# Patient Record
Sex: Male | Born: 1945 | Hispanic: No | Marital: Married | Smoking: Never smoker
Health system: Southern US, Community
[De-identification: ages and names within clinical notes are randomized; demographics above are authoritative.]

## PROBLEM LIST (undated history)

## (undated) DIAGNOSIS — K5792 Diverticulitis of intestine, part unspecified, without perforation or abscess without bleeding: Secondary | ICD-10-CM

## (undated) DIAGNOSIS — C439 Malignant melanoma of skin, unspecified: Secondary | ICD-10-CM

## (undated) DIAGNOSIS — I10 Essential (primary) hypertension: Secondary | ICD-10-CM

## (undated) DIAGNOSIS — E785 Hyperlipidemia, unspecified: Secondary | ICD-10-CM

## (undated) DIAGNOSIS — I4891 Unspecified atrial fibrillation: Secondary | ICD-10-CM

## (undated) HISTORY — DX: Unspecified atrial fibrillation: I48.91

## (undated) HISTORY — DX: Malignant melanoma of skin, unspecified: C43.9

## (undated) HISTORY — PX: REDUCTION OF TORSION OF TESTIS: SUR1096

## (undated) HISTORY — DX: Hyperlipidemia, unspecified: E78.5

---

## 1998-03-27 ENCOUNTER — Other Ambulatory Visit: Admission: RE | Admit: 1998-03-27 | Discharge: 1998-03-27 | Payer: Self-pay | Admitting: Dermatology

## 1998-04-03 ENCOUNTER — Other Ambulatory Visit: Admission: RE | Admit: 1998-04-03 | Discharge: 1998-04-03 | Payer: Self-pay | Admitting: Dermatology

## 2013-07-04 DIAGNOSIS — I10 Essential (primary) hypertension: Secondary | ICD-10-CM | POA: Insufficient documentation

## 2014-03-29 DIAGNOSIS — Z0000 Encounter for general adult medical examination without abnormal findings: Secondary | ICD-10-CM | POA: Insufficient documentation

## 2016-02-13 DIAGNOSIS — Z1159 Encounter for screening for other viral diseases: Secondary | ICD-10-CM | POA: Insufficient documentation

## 2016-02-13 DIAGNOSIS — K4090 Unilateral inguinal hernia, without obstruction or gangrene, not specified as recurrent: Secondary | ICD-10-CM | POA: Insufficient documentation

## 2016-04-13 ENCOUNTER — Emergency Department (HOSPITAL_COMMUNITY)
Admission: EM | Admit: 2016-04-13 | Discharge: 2016-04-13 | Disposition: A | Discharge status: Home / Self Care | Payer: Medicare Other | Attending: Emergency Medicine | Admitting: Emergency Medicine

## 2016-04-13 ENCOUNTER — Emergency Department (HOSPITAL_COMMUNITY): Payer: Medicare Other

## 2016-04-13 ENCOUNTER — Encounter (HOSPITAL_COMMUNITY)
Admission: EM | Disposition: A | Discharge status: Home / Self Care | Payer: Self-pay | Source: Home / Self Care | Attending: Emergency Medicine

## 2016-04-13 ENCOUNTER — Emergency Department (HOSPITAL_COMMUNITY): Payer: Medicare Other | Admitting: Anesthesiology

## 2016-04-13 ENCOUNTER — Encounter (HOSPITAL_COMMUNITY): Payer: Self-pay

## 2016-04-13 DIAGNOSIS — W270XXA Contact with workbench tool, initial encounter: Secondary | ICD-10-CM | POA: Insufficient documentation

## 2016-04-13 DIAGNOSIS — S61211A Laceration without foreign body of left index finger without damage to nail, initial encounter: Secondary | ICD-10-CM | POA: Insufficient documentation

## 2016-04-13 DIAGNOSIS — S61412A Laceration without foreign body of left hand, initial encounter: Secondary | ICD-10-CM

## 2016-04-13 DIAGNOSIS — S61219A Laceration without foreign body of unspecified finger without damage to nail, initial encounter: Secondary | ICD-10-CM

## 2016-04-13 DIAGNOSIS — I10 Essential (primary) hypertension: Secondary | ICD-10-CM | POA: Diagnosis not present

## 2016-04-13 DIAGNOSIS — S61213A Laceration without foreign body of left middle finger without damage to nail, initial encounter: Secondary | ICD-10-CM | POA: Diagnosis not present

## 2016-04-13 DIAGNOSIS — Z880 Allergy status to penicillin: Secondary | ICD-10-CM | POA: Insufficient documentation

## 2016-04-13 HISTORY — DX: Essential (primary) hypertension: I10

## 2016-04-13 HISTORY — PX: TENDON REPAIR: SHX5111

## 2016-04-13 SURGERY — TENDON REPAIR
Anesthesia: General | Site: Hand | Laterality: Left

## 2016-04-13 MED ORDER — TETANUS-DIPHTH-ACELL PERTUSSIS 5-2.5-18.5 LF-MCG/0.5 IM SUSP
0.5000 mL | Freq: Once | INTRAMUSCULAR | Status: AC
  Administered 2016-04-13: 0.5 mL via INTRAMUSCULAR
  Filled 2016-04-13: qty 0.5

## 2016-04-13 MED ORDER — VANCOMYCIN HCL IN DEXTROSE 1-5 GM/200ML-% IV SOLN
1000.0000 mg | Freq: Once | INTRAVENOUS | Status: AC
  Administered 2016-04-13: 1000 mg via INTRAVENOUS
  Filled 2016-04-13: qty 200

## 2016-04-13 MED ORDER — BUPIVACAINE HCL (PF) 0.25 % IJ SOLN
INTRAMUSCULAR | Status: AC
  Filled 2016-04-13: qty 30

## 2016-04-13 MED ORDER — SUFENTANIL CITRATE 50 MCG/ML IV SOLN
INTRAVENOUS | Status: DC | PRN
  Administered 2016-04-13 (×2): 5 ug via INTRAVENOUS

## 2016-04-13 MED ORDER — BUPIVACAINE HCL (PF) 0.25 % IJ SOLN
INTRAMUSCULAR | Status: DC | PRN
  Administered 2016-04-13: 10 mL

## 2016-04-13 MED ORDER — SODIUM CHLORIDE 0.9 % IJ SOLN
INTRAMUSCULAR | Status: AC
  Filled 2016-04-13: qty 10

## 2016-04-13 MED ORDER — ONDANSETRON HCL 4 MG/2ML IJ SOLN
INTRAMUSCULAR | Status: AC
  Filled 2016-04-13: qty 2

## 2016-04-13 MED ORDER — EPHEDRINE SULFATE 50 MG/ML IJ SOLN
INTRAMUSCULAR | Status: DC | PRN
  Administered 2016-04-13 (×2): 10 mg via INTRAVENOUS
  Administered 2016-04-13: 5 mg via INTRAVENOUS
  Administered 2016-04-13: 10 mg via INTRAVENOUS

## 2016-04-13 MED ORDER — PROMETHAZINE HCL 25 MG/ML IJ SOLN: 6.2500 mg | INTRAMUSCULAR | Status: DC | PRN

## 2016-04-13 MED ORDER — LACTATED RINGERS IV SOLN
INTRAVENOUS | Status: DC | PRN
  Administered 2016-04-13 (×2): via INTRAVENOUS

## 2016-04-13 MED ORDER — LIDOCAINE HCL (CARDIAC) 20 MG/ML IV SOLN
INTRAVENOUS | Status: DC | PRN
  Administered 2016-04-13: 100 mg via INTRAVENOUS

## 2016-04-13 MED ORDER — MIDAZOLAM HCL 5 MG/5ML IJ SOLN
INTRAMUSCULAR | Status: DC | PRN
Start: 2016-04-13 — End: 2016-04-13
  Administered 2016-04-13: 2 mg via INTRAVENOUS

## 2016-04-13 MED ORDER — SUFENTANIL CITRATE 50 MCG/ML IV SOLN
INTRAVENOUS | Status: AC
  Filled 2016-04-13: qty 1

## 2016-04-13 MED ORDER — 0.9 % SODIUM CHLORIDE (POUR BTL) OPTIME
TOPICAL | Status: DC | PRN
  Administered 2016-04-13: 4000 mL

## 2016-04-13 MED ORDER — MIDAZOLAM HCL 2 MG/2ML IJ SOLN
INTRAMUSCULAR | Status: AC
  Filled 2016-04-13: qty 2

## 2016-04-13 MED ORDER — DEXAMETHASONE SODIUM PHOSPHATE 10 MG/ML IJ SOLN
INTRAMUSCULAR | Status: AC
  Filled 2016-04-13: qty 1

## 2016-04-13 MED ORDER — LIDOCAINE 2% (20 MG/ML) 5 ML SYRINGE
INTRAMUSCULAR | Status: AC
  Filled 2016-04-13: qty 5

## 2016-04-13 MED ORDER — FENTANYL CITRATE (PF) 100 MCG/2ML IJ SOLN: 25.0000 ug | INTRAMUSCULAR | Status: DC | PRN

## 2016-04-13 MED ORDER — PROPOFOL 10 MG/ML IV BOLUS
INTRAVENOUS | Status: DC | PRN
Start: 2016-04-13 — End: 2016-04-13
  Administered 2016-04-13: 100 mg via INTRAVENOUS

## 2016-04-13 MED ORDER — METOCLOPRAMIDE HCL 5 MG/ML IJ SOLN
INTRAMUSCULAR | Status: DC | PRN
  Administered 2016-04-13: 10 mg via INTRAVENOUS

## 2016-04-13 MED ORDER — MORPHINE SULFATE (PF) 4 MG/ML IV SOLN
4.0000 mg | Freq: Once | INTRAVENOUS | Status: AC
  Administered 2016-04-13: 4 mg via INTRAVENOUS
  Filled 2016-04-13: qty 1

## 2016-04-13 MED ORDER — PROPOFOL 10 MG/ML IV BOLUS
INTRAVENOUS | Status: AC
  Filled 2016-04-13: qty 20

## 2016-04-13 MED ORDER — LIDOCAINE HCL 2 % IJ SOLN
20.0000 mL | Freq: Once | INTRAMUSCULAR | Status: AC
  Administered 2016-04-13: 400 mg via INTRADERMAL
  Filled 2016-04-13: qty 20

## 2016-04-13 MED ORDER — ONDANSETRON HCL 4 MG/2ML IJ SOLN
INTRAMUSCULAR | Status: DC | PRN
  Administered 2016-04-13: 4 mg via INTRAVENOUS

## 2016-04-13 MED ORDER — DEXAMETHASONE SODIUM PHOSPHATE 4 MG/ML IJ SOLN
INTRAMUSCULAR | Status: DC | PRN
  Administered 2016-04-13: 10 mg via INTRAVENOUS

## 2016-04-13 MED ORDER — METOCLOPRAMIDE HCL 5 MG/ML IJ SOLN
INTRAMUSCULAR | Status: AC
  Filled 2016-04-13: qty 2

## 2016-04-13 SURGICAL SUPPLY — 51 items
BANDAGE ACE 3X5.8 VEL STRL LF (GAUZE/BANDAGES/DRESSINGS) ×3 IMPLANT
BANDAGE ACE 4X5 VEL STRL LF (GAUZE/BANDAGES/DRESSINGS) ×3 IMPLANT
BANDAGE ELASTIC 3 VELCRO ST LF (GAUZE/BANDAGES/DRESSINGS) IMPLANT
BANDAGE ELASTIC 4 VELCRO ST LF (GAUZE/BANDAGES/DRESSINGS) IMPLANT
BANDAGE GAUZE 4  KLING STR (GAUZE/BANDAGES/DRESSINGS) ×3 IMPLANT
BNDG COHESIVE 1X5 TAN STRL LF (GAUZE/BANDAGES/DRESSINGS) IMPLANT
BNDG GAUZE ELAST 4 BULKY (GAUZE/BANDAGES/DRESSINGS) IMPLANT
CORDS BIPOLAR (ELECTRODE) ×3 IMPLANT
COVER SURGICAL LIGHT HANDLE (MISCELLANEOUS) ×3 IMPLANT
CUFF TOURNIQUET SINGLE 18IN (TOURNIQUET CUFF) IMPLANT
CUFF TOURNIQUET SINGLE 24IN (TOURNIQUET CUFF) IMPLANT
DECANTER SPIKE VIAL GLASS SM (MISCELLANEOUS) ×3 IMPLANT
DRAPE OEC MINIVIEW 54X84 (DRAPES) IMPLANT
DRAPE SURG 17X23 STRL (DRAPES) ×3 IMPLANT
GAUZE SPONGE 2X2 8PLY STRL LF (GAUZE/BANDAGES/DRESSINGS) IMPLANT
GAUZE SPONGE 4X4 12PLY STRL (GAUZE/BANDAGES/DRESSINGS) IMPLANT
GAUZE XEROFORM 1X8 LF (GAUZE/BANDAGES/DRESSINGS) IMPLANT
GAUZE XEROFORM 5X9 LF (GAUZE/BANDAGES/DRESSINGS) ×3 IMPLANT
GLOVE BIOGEL M 8.0 STRL (GLOVE) ×3 IMPLANT
GOWN STRL REUS W/ TWL LRG LVL3 (GOWN DISPOSABLE) ×2 IMPLANT
GOWN STRL REUS W/ TWL XL LVL3 (GOWN DISPOSABLE) ×1 IMPLANT
GOWN STRL REUS W/TWL LRG LVL3 (GOWN DISPOSABLE) ×6
GOWN STRL REUS W/TWL XL LVL3 (GOWN DISPOSABLE) ×3
KIT BASIN OR (CUSTOM PROCEDURE TRAY) ×3 IMPLANT
KIT ROOM TURNOVER OR (KITS) ×3 IMPLANT
MANIFOLD NEPTUNE II (INSTRUMENTS) ×3 IMPLANT
NEEDLE HYPO 25GX1X1/2 BEV (NEEDLE) IMPLANT
NS IRRIG 1000ML POUR BTL (IV SOLUTION) ×3 IMPLANT
PACK ORTHO EXTREMITY (CUSTOM PROCEDURE TRAY) ×3 IMPLANT
PAD ARMBOARD 7.5X6 YLW CONV (MISCELLANEOUS) ×6 IMPLANT
PAD CAST 4YDX4 CTTN HI CHSV (CAST SUPPLIES) IMPLANT
PADDING CAST COTTON 4X4 STRL (CAST SUPPLIES)
SOAP 2 % CHG 4 OZ (WOUND CARE) ×3 IMPLANT
SPECIMEN JAR SMALL (MISCELLANEOUS) ×3 IMPLANT
SPLINT FIBERGLASS 3X12 (CAST SUPPLIES) ×3 IMPLANT
SPONGE GAUZE 2X2 STER 10/PKG (GAUZE/BANDAGES/DRESSINGS)
SPONGE GAUZE 4X4 12PLY STER LF (GAUZE/BANDAGES/DRESSINGS) ×3 IMPLANT
SPONGE SCRUB IODOPHOR (GAUZE/BANDAGES/DRESSINGS) ×3 IMPLANT
SUCTION FRAZIER HANDLE 10FR (MISCELLANEOUS)
SUCTION TUBE FRAZIER 10FR DISP (MISCELLANEOUS) IMPLANT
SUT MERSILENE 4 0 P 3 (SUTURE) IMPLANT
SUT PROLENE 4 0 PS 2 18 (SUTURE) IMPLANT
SUT VIC AB 2-0 CT1 27 (SUTURE)
SUT VIC AB 2-0 CT1 TAPERPNT 27 (SUTURE) IMPLANT
SYR CONTROL 10ML LL (SYRINGE) IMPLANT
TOWEL OR 17X24 6PK STRL BLUE (TOWEL DISPOSABLE) ×3 IMPLANT
TOWEL OR 17X26 10 PK STRL BLUE (TOWEL DISPOSABLE) ×3 IMPLANT
TUBE CONNECTING 12'X1/4 (SUCTIONS)
TUBE CONNECTING 12X1/4 (SUCTIONS) IMPLANT
UNDERPAD 30X30 INCONTINENT (UNDERPADS AND DIAPERS) ×3 IMPLANT
WATER STERILE IRR 1000ML POUR (IV SOLUTION) ×3 IMPLANT

## 2016-04-13 NOTE — Discharge Instructions (Signed)
Discharge Instructions:  Keep your dressing clean, dry and in place until instructed to remove by Dr. Lenon Curt.  If the dressing becomes dirty or wet call the office for instructions during business hours. Elevate the extremity to help with swelling, this will also help with any discomfort. Take your medication as prescribed. No lifting with the injured  extremity. If you feel that the dressing is too tight, you may loosen it, but keep it on; finger tips should be pink; if there is a concern, call the office. [phone] Ice may be used if the injury is a fracture, do not apply ice directly to the skin. Please call the office on the next business day after discharge to arrange a follow up appointment.  Call [phone] between the hours of 9am - 5pm M-Th or 9am - 1pm on Fri. For most hand injuries and/or conditions, you may return to work using the uninjured hand (one handed duty) within 24-72 hours.  A detailed note will be provided to you at your follow up appointment or may contact the office prior to your follow up.    Laceration Care, Adult A laceration is a cut that goes through all layers of the skin. The cut also goes into the tissue that is right under the skin. Some cuts heal on their own. Others need to be closed with stitches (sutures), staples, skin adhesive strips, or wound glue. Taking care of your cut lowers your risk of infection and helps your cut to heal better. HOW TO TAKE CARE OF YOUR CUT For stitches or staples:  Keep the wound clean and dry.  If you were given a bandage (dressing), you should change it at least one time per day or as told by your doctor. You should also change it if it gets wet or dirty.  Keep the wound completely dry for the first 24 hours or as told by your doctor. After that time, you may take a shower or a bath. However, make sure that the wound is not soaked in water until after the stitches or staples have been removed.  Clean the wound one  time each day or as told by your doctor:  Wash the wound with soap and water.  Rinse the wound with water until all of the soap comes off.  Pat the wound dry with a clean towel. Do not rub the wound.  After you clean the wound, put a thin layer of antibiotic ointment on it as told by your doctor. This ointment:  Helps to prevent infection.  Keeps the bandage from sticking to the wound.  Have your stitches or staples removed as told by your doctor. If your doctor used skin adhesive strips:   Keep the wound clean and dry.  If you were given a bandage, you should change it at least one time per day or as told by your doctor. You should also change it if it gets dirty or wet.  Do not get the skin adhesive strips wet. You can take a shower or a bath, but be careful to keep the wound dry.  If the wound gets wet, pat it dry with a clean towel. Do not rub the wound.  Skin adhesive strips fall off on their own. You can trim the strips as the wound heals. Do not remove any strips that are still stuck to the wound. They will fall off after a while. If your doctor used wound glue:  Try to keep your wound  dry, but you may briefly wet it in the shower or bath. Do not soak the wound in water, such as by swimming.  After you take a shower or a bath, gently pat the wound dry with a clean towel. Do not rub the wound.  Do not do any activities that will make you really sweaty until the skin glue has fallen off on its own.  Do not apply liquid, cream, or ointment medicine to your wound while the skin glue is still on.  If you were given a bandage, you should change it at least one time per day or as told by your doctor. You should also change it if it gets dirty or wet.  If a bandage is placed over the wound, do not let the tape for the bandage touch the skin glue.  Do not pick at the glue. The skin glue usually stays on for 5-10 days. Then, it falls off of the skin. General Instructions  To  help prevent scarring, make sure to cover your wound with sunscreen whenever you are outside after stitches are removed, after adhesive strips are removed, or when wound glue stays in place and the wound is healed. Make sure to wear a sunscreen of at least 30 SPF.  Take over-the-counter and prescription medicines only as told by your doctor.  If you were given antibiotic medicine or ointment, take or apply it as told by your doctor. Do not stop using the antibiotic even if your wound is getting better.  Do not scratch or pick at the wound.  Keep all follow-up visits as told by your doctor. This is important.  Check your wound every day for signs of infection. Watch for:  Redness, swelling, or pain.  Fluid, blood, or pus.  Raise (elevate) the injured area above the level of your heart while you are sitting or lying down, if possible. GET HELP IF:  You got a tetanus shot and you have any of these problems at the injection site:  Swelling.  Very bad pain.  Redness.  Bleeding.  You have a fever.  A wound that was closed breaks open.  You notice a bad smell coming from your wound or your bandage.  You notice something coming out of the wound, such as wood or glass.  Medicine does not help your pain.  You have more redness, swelling, or pain at the site of your wound.  You have fluid, blood, or pus coming from your wound.  You notice a change in the color of your skin near your wound.  You need to change the bandage often because fluid, blood, or pus is coming from the wound.  You start to have a new rash.  You start to have numbness around the wound. GET HELP RIGHT AWAY IF:  You have very bad swelling around the wound.  Your pain suddenly gets worse and is very bad.  You notice painful lumps near the wound or on skin that is anywhere on your body.  You have a red streak going away from your wound.  The wound is on your hand or foot and you cannot move a finger  or toe like you usually can.  The wound is on your hand or foot and you notice that your fingers or toes look pale or bluish.   This information is not intended to replace advice given to you by your health care provider. Make sure you discuss any questions you have with your health  care provider.   Document Released: 03/18/2008 Document Revised: 02/14/2015 Document Reviewed: 09/26/2014 Elsevier Interactive Patient Education [employer].

## 2016-04-13 NOTE — Anesthesia Postprocedure Evaluation (Signed)
Anesthesia Post Note  Patient: Darryl Powers  Procedure(s) Performed: Procedure(s) (LRB): REPAIR TENDON NERVE INDEX AND LONG FINGER (Left)  Patient location during evaluation: PACU Anesthesia Type: General Level of consciousness: awake and alert Pain management: pain level controlled Vital Signs Assessment: post-procedure vital signs reviewed and stable Respiratory status: spontaneous breathing, nonlabored ventilation, respiratory function stable and patient connected to nasal cannula oxygen Cardiovascular status: blood pressure returned to baseline and stable Postop Assessment: no signs of nausea or vomiting Anesthetic complications: no    Last Vitals:  Filed Vitals:   04/13/16 2107 04/13/16 2115  BP: 127/66 137/71  Pulse: 74 80  Temp: 36.4 C   Resp: 13 17    Last Pain:  Filed Vitals:   04/13/16 2119  PainSc: 0-No pain                 Caera Enwright S

## 2016-04-13 NOTE — ED Provider Notes (Signed)
Patient injured his left hand on a table saw this afternoon. On exam patient has macerated laceration to index finger full or aspect of distal phalanx and macerated laceration to middle phalanx of middle finger, volar aspect. X-ray viewed by me  Orlie Dakin, MD 04/13/16 1655

## 2016-04-13 NOTE — Anesthesia Preprocedure Evaluation (Signed)
Anesthesia Evaluation  Patient identified by MRN, date of birth, ID band Patient awake    Reviewed: Allergy & Precautions, NPO status , Patient's Chart, lab work & pertinent test results  Airway Mallampati: II  TM Distance: >3 FB Neck ROM: Full    Dental no notable dental hx.    Pulmonary neg pulmonary ROS,    Pulmonary exam normal breath sounds clear to auscultation       Cardiovascular hypertension, Pt. on medications Normal cardiovascular exam Rhythm:Regular Rate:Normal     Neuro/Psych negative neurological ROS  negative psych ROS   GI/Hepatic negative GI ROS, Neg liver ROS,   Endo/Other  negative endocrine ROS  Renal/GU negative Renal ROS  negative genitourinary   Musculoskeletal negative musculoskeletal ROS (+)   Abdominal   Peds negative pediatric ROS (+)  Hematology negative hematology ROS (+)   Anesthesia Other Findings   Reproductive/Obstetrics negative OB ROS                             Anesthesia Physical Anesthesia Plan  ASA: II  Anesthesia Plan: General   Post-op Pain Management:    Induction: Intravenous  Airway Management Planned: LMA and Oral ETT  Additional Equipment:   Intra-op Plan:   Post-operative Plan: Extubation in OR  Informed Consent: I have reviewed the patients History and Physical, chart, labs and discussed the procedure including the risks, benefits and alternatives for the proposed anesthesia with the patient or authorized representative who has indicated his/her understanding and acceptance.   Dental advisory given  Plan Discussed with: CRNA and Surgeon  Anesthesia Plan Comments:         Anesthesia Quick Evaluation

## 2016-04-13 NOTE — Progress Notes (Signed)
Report given to Melissa, RN

## 2016-04-13 NOTE — H&P (Signed)
Reason for Consult:lacerations of L fingers Referring Physician: ER  CC:I cut my fingers on a saw  HPI:  Darryl Powers is an 70 y.o. right handed male who presents with laceration of LIF, LLF from saw this afternoon.       .   Pain is rated at    8/10 and is described as sharp/dull.  Pain is constant.  Pain is made better by rest/immobilization, worse with motion.   Associated signs/symptoms:inablility to flex IF, altered sensation to IF, LF Previous treatment:    Past Medical History  Diagnosis Date  . Hypertension     History reviewed. No pertinent past surgical history.  No family history on file.  Social History:  reports that he has never smoked. He does not have any smokeless tobacco history on file. His alcohol and drug histories are not on file.  Allergies:  Allergies  Allergen Reactions  . Ace Inhibitors Cough  . Amoxicillin-Pot Clavulanate Rash    Medications: I have reviewed the patient's current medications.  No results found for this or any previous visit (from the past 48 hour(s)).  Dg Hand Complete Left  04/13/2016  CLINICAL DATA:  Accident with saw involving multiple left fingers today. Laceration to distal index finger and proximal left middle finger. EXAM: LEFT HAND - COMPLETE 3+ VIEW COMPARISON:  None. FINDINGS: Overlying bandage is present. Metallic ring over the fourth proximal phalanx. Degenerative changes of the radiocarpal joint and first and second carpometacarpal joints. Degenerative changes of the interphalangeal joints. Evidence of soft tissue laceration adjacent the second distal phalanx. No definite underlying fracture or foreign body. IMPRESSION: No acute fracture or radiopaque foreign body. Degenerative changes as described. Electronically Signed   By: Marin Olp M.D.   On: 04/13/2016 15:08    Pertinent items are noted in HPI. Temp:  [97.9 F (36.6 C)] 97.9 F (36.6 C) (07/01 1259) Pulse Rate:  [90-96] 94 (07/01 1600) Resp:  [20-22] 20 (07/01  1502) BP: (145-164)/(75-104) 146/80 mmHg (07/01 1600) SpO2:  [95 %-99 %] 97 % (07/01 1600) General appearance: alert and cooperative Resp: clear to auscultation bilaterally Cardio: regular rate and rhythm GI: soft, non-tender; bowel sounds normal; no masses,  no organomegaly Extremities: IF with complex laceration volar ulnar over mioddle phalanx, finger without flexion tone, inability to flex, radial n/v intact, unlan protective; LLF, with complex laceration over prox/middle phalanx, able to flex, finger tip pink   Assessment: Complex lacerations of LF, IF, flexor tendon injury to IF, ? Nerve injuries to IF, LF Plan: To OR for exploration and repair I have discussed this treatment plan in detail with patient and family, including the risks of the recommended treatment / surgery, the benefits and the alternatives.  The patient understands that additional treatment may be necessary.  Darryl Powers CHRISTOPHER 04/13/2016, 4:16 PM

## 2016-04-13 NOTE — Progress Notes (Signed)
Family at bedside. Son has belongings. Key at bedside.

## 2016-04-13 NOTE — Transfer of Care (Signed)
Immediate Anesthesia Transfer of Care Note  Patient: Darryl Powers  Procedure(s) Performed: Procedure(s): REPAIR TENDON NERVE INDEX AND LONG FINGER (Left)  Patient Location: PACU  Anesthesia Type:General  Level of Consciousness: oriented, sedated, patient cooperative and responds to stimulation  Airway & Oxygen Therapy: Patient Spontanous Breathing and Patient connected to nasal cannula oxygen  Post-op Assessment: Report given to RN, Post -op Vital signs reviewed and stable and Patient moving all extremities X 4  Post vital signs: Reviewed and stable  Last Vitals:  Filed Vitals:   04/13/16 1930 04/13/16 1931  BP:    Pulse: 57 55  Temp:    Resp: 17 17    Last Pain:  Filed Vitals:   04/13/16 2110  PainSc: 0-No pain         Complications: No apparent anesthesia complications

## 2016-04-13 NOTE — Anesthesia Procedure Notes (Signed)
Procedure Name: LMA Insertion Date/Time: 04/13/2016 7:41 PM Performed by: Claris Che Pre-anesthesia Checklist: Patient identified, Emergency Drugs available, Suction available, Patient being monitored and Timeout performed Patient Re-evaluated:Patient Re-evaluated prior to inductionOxygen Delivery Method: Circle system utilized Preoxygenation: Pre-oxygenation with 100% oxygen Intubation Type: IV induction Ventilation: Mask ventilation without difficulty LMA: LMA inserted LMA Size: 4.0 Number of attempts: 1 Placement Confirmation: CO2 detector and breath sounds checked- equal and bilateral Tube secured with: Tape Dental Injury: Teeth and Oropharynx as per pre-operative assessment

## 2016-04-13 NOTE — ED Provider Notes (Signed)
CSN: [id number]     Arrival date & time 04/13/16  1249 History   First MD Initiated Contact with Patient 04/13/16 1306     No chief complaint on file.  HPI   Darryl Powers is a 70 y.o. male PMH significant for HTN presenting with left hand injury. He was using a power table saw this afternoon and accidentally cut his left index and middle fingers. He is unsure of last tetanus. He is right hand dominant. He is an Photographer. He endorses numbness at his distal left index and middle fingers. No fevers, chills, drainage, nausea/vomiting. Bleeding controlled at this time.   Past Medical History  Diagnosis Date  . Hypertension    History reviewed. No pertinent past surgical history. No family history on file. Social History  Substance Use Topics  . Smoking status: Never Smoker   . Smokeless tobacco: None  . Alcohol Use: None    Review of Systems  Ten systems are reviewed and are negative for acute change except as noted in the HPI  Allergies  Review of patient's allergies indicates not on file.  Home Medications   Prior to Admission medications   Not on File   BP 152/85 mmHg  Pulse 90  Temp(Src) 97.9 F (36.6 C) (Oral)  Resp 20  SpO2 97% Physical Exam  Constitutional: He appears well-developed and well-nourished. No distress.  HENT:  Head: Normocephalic and atraumatic.  Eyes: Conjunctivae are normal. Right eye exhibits no discharge. Left eye exhibits no discharge. No scleral icterus.  Neck: No tracheal deviation present.  Cardiovascular: Normal rate and intact distal pulses.   Pulmonary/Chest: Effort normal. No respiratory distress.  Abdominal: Soft. He exhibits no distension.  Musculoskeletal: He exhibits edema.  Left 2nd digit with deep laceration distal phalanx. Left 3rd digit with deep laceration at proximal phalanx palmar aspect. Decreased sensation at left index and middle finger.   Lymphadenopathy:    He has no cervical adenopathy.  Neurological: He is alert.  Coordination normal.  Skin: Skin is warm and dry. No rash noted. He is not diaphoretic. No erythema.  Psychiatric: He has a normal mood and affect. His behavior is normal.  Nursing note and vitals reviewed.   ED Course  Procedures  Imaging Review Dg Hand Complete Left  04/13/2016  CLINICAL DATA:  Accident with saw involving multiple left fingers today. Laceration to distal index finger and proximal left middle finger. EXAM: LEFT HAND - COMPLETE 3+ VIEW COMPARISON:  None. FINDINGS: Overlying bandage is present. Metallic ring over the fourth proximal phalanx. Degenerative changes of the radiocarpal joint and first and second carpometacarpal joints. Degenerative changes of the interphalangeal joints. Evidence of soft tissue laceration adjacent the second distal phalanx. No definite underlying fracture or foreign body. IMPRESSION: No acute fracture or radiopaque foreign body. Degenerative changes as described. Electronically Signed   By: Marin Olp M.D.   On: 04/13/2016 15:08   I have personally reviewed and evaluated these images and lab results as part of my medical decision-making.  MDM   Final diagnoses:  Laceration of multiple sites of hand and fingers, left, initial encounter   Patient s/p powersaw injury at left 2nd and 3rd digits. 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 Left 2nd digit with deep laceration distal phalanx. Left 3rd digit with deep laceration at proximal phalanx palmar aspect. Decreased sensation at left index and middle finger.  While in xray, patient's left middle finger start spurting blood. Bleeding was controlled, and patient  reexamined in ED with arterial bleeding present at left middle finger.  Xray negative for fx.  Tetanus updated.  Dr. Lenon Curt evaluated patient and advised surgery. Patient care handoff to Dr. Lenon Curt. Patient in understanding and agreement with the plan.    [place], PA-C 04/19/16 [place], MD 04/19/16 1731

## 2016-04-13 NOTE — Progress Notes (Addendum)
Patient called stating that he was feeling dizzy and like he was going to pass out. Patient noted to be pale and diaphortic. Patient placed in trendelenburg position. IVF opened to wide open, BP checked as noted on flow sheet. Charge CRNA called for assistance. Placed on monitor, SpO2, and oxygen

## 2016-04-13 NOTE — ED Notes (Signed)
Radiology contacted RN about emergency in xray. Radiology reports when they moved left middle finger that blood started spurting out. RN accompanied radiology to bring patient back. EDP at bedside.

## 2016-04-13 NOTE — ED Notes (Signed)
Patient here with left hand fingers injury after cutting same on saw this afternoon.  Cuts jagged and bleeding controlled with pressure, states the digits are numb, saline dressing applied

## 2016-04-14 NOTE — Op Note (Signed)
NAMEMICHEAUX, MATHESON NO.:  [id number]  MEDICAL RECORD NO.:  [id number]  LOCATION:  [facility]                         FACILITY:  [facility]  PHYSICIAN:  Dennie Bible, MD    DATE OF BIRTH:  Apr 14, 1946  DATE OF PROCEDURE:  04/13/2016 DATE OF DISCHARGE:  04/13/2016                              OPERATIVE REPORT   PREOPERATIVE DIAGNOSIS:  Complex lacerations and saw injury to the left index and left long fingers.  POSTOPERATIVE DIAGNOSIS:  Complex lacerations and saw injury to the left index and left long fingers.  ANESTHESIA:  General.  SPECIMENS:  None.  ESTIMATED BLOOD LOSS:  Minimal.  PROCEDURE: 1. Exploration of complex wounds x2 of the left index and left long     finger. 2. Debridement of full-thickness skin and subcutaneous tissue of the     left long finger.  Repair of the ulnar slip of the FDS tendon in     zone 2 of the left long finger. 3. Closure of laceration. 4. Exploration of complex wound of the left index finger.  Repair of     flexor tendon, x2, FDS and FDP in zone 2 of the left index finger,     complex closure of laceration, left index finger.  INDICATIONS:  Mr. Lalor is a 70 year old gentleman who was working with some type of saw this afternoon, hand slipped and went into the saw blade sustaining a severe laceration to his index and long fingers, presented to the emergency department for evaluation of his index finger which was held in a straight position indicating most likely tendon injury.  The patient had decreased sensation in both fingers as well as active bleeding.  He was consented and urgently taken to the operating room.  Consent was obtained.  PROCEDURE IN DETAIL:  Patient was taken to the operating room, placed supine on the operating table.  General anesthesia was administered without difficulty.  A time-out was performed.  The left upper extremity was prepped and draped in normal sterile fashion.  A tourniquet was used on  the upper arm.  The arm was exsanguinated and tourniquet was inflated to 250.  After sterile draping, the wounds were evaluated, the long finger wound was evaluated 1st.  Irrigation of the wound was performed with about 1.5 L of saline solution through cysto-tubing.  The laceration was deep overlying the proximal phalanx.  The ulnar slip of the FDS tendon was lacerated, this was repaired with 4-0 FiberWire.  The neurovascular bundle of the ulnar side of the finger was obliterated from the saw injury.  Both the proximal and distal portions were isolated; however there was a large gap in each of these.  The radial sided digital artery was intact.  The tourniquet was released and the finger returned of pink color.  This wound was closed with multiple interrupted 5-0 nylon sutures.  Next, the index the more serious of the injuries was stressed.  The finger was essentially flayed open from the volar distal pad of finger back to the proximal phalanx.  There was some bone loss.  There was laceration of both the FDS and FDP tendons.  The neurovascular bundle on  the ulnar side was also essentially obliterated. The skin edges were very ragged, debridement of full-thickness skin and subcutaneous tissue was then performed.  The wound had to be lengthened proximally to gain access of the proximal portion of the flexor tendons. This was performed.  The FDS and FDP tendons were brought into the field.  The distal portions were visible in the open wound.  The FDS was 1st repaired with 4-0 FiberWire followed by the FDP tendon.  The epitendinous repair with 5-0 Prolene was performed to tidy up the ends of the repair.  After mobilizing the ulnar digital artery, both proximally and distally, there was still a gap of approximately a cm. The nerve was had a gap of approximately 2 cm and therefore even with a nerve tube, I did not feel this repair was warranted.  After additional irrigation, the skin flap was  advanced.  There was some nonviable skin distally advanced over the wound and closed with multiple 5-0 nylon sutures.  Tourniquet was released and the large flap of skin on the index finger returned to nice pink color.  The very tip of the finger was somewhat dusky.  The long finger returned to a pink color as well. The patient was then placed in a sterile dressing and splint, tolerated procedure well, was taken to recovery room stable.     Dennie Bible, MD     HCC/MEDQ  D:  04/13/2016  T:  04/14/2016  Job:  [id number]

## 2016-04-15 ENCOUNTER — Encounter (HOSPITAL_COMMUNITY): Payer: Self-pay | Admitting: General Surgery

## 2017-02-13 DIAGNOSIS — R748 Abnormal levels of other serum enzymes: Secondary | ICD-10-CM | POA: Insufficient documentation

## 2017-03-13 DIAGNOSIS — M20032 Swan-neck deformity of left finger(s): Secondary | ICD-10-CM | POA: Insufficient documentation

## 2017-03-26 DIAGNOSIS — S56129S Laceration of flexor muscle, fascia and tendon of unspecified finger at forearm level, sequela: Secondary | ICD-10-CM | POA: Insufficient documentation

## 2017-08-19 DIAGNOSIS — Z1211 Encounter for screening for malignant neoplasm of colon: Secondary | ICD-10-CM | POA: Insufficient documentation

## 2020-01-23 ENCOUNTER — Emergency Department (HOSPITAL_COMMUNITY): Payer: Medicare Other

## 2020-01-23 ENCOUNTER — Other Ambulatory Visit: Payer: Self-pay

## 2020-01-23 ENCOUNTER — Observation Stay (HOSPITAL_COMMUNITY)
Admission: EM | Admit: 2020-01-23 | Discharge: 2020-01-24 | Disposition: A | Discharge status: Home / Self Care | Payer: Medicare Other | Attending: Family Medicine | Admitting: Family Medicine

## 2020-01-23 ENCOUNTER — Encounter (HOSPITAL_COMMUNITY): Payer: Self-pay | Admitting: Emergency Medicine

## 2020-01-23 DIAGNOSIS — Z79899 Other long term (current) drug therapy: Secondary | ICD-10-CM | POA: Insufficient documentation

## 2020-01-23 DIAGNOSIS — I10 Essential (primary) hypertension: Secondary | ICD-10-CM | POA: Insufficient documentation

## 2020-01-23 DIAGNOSIS — R7989 Other specified abnormal findings of blood chemistry: Secondary | ICD-10-CM | POA: Insufficient documentation

## 2020-01-23 DIAGNOSIS — K5792 Diverticulitis of intestine, part unspecified, without perforation or abscess without bleeding: Secondary | ICD-10-CM | POA: Diagnosis present

## 2020-01-23 DIAGNOSIS — N179 Acute kidney failure, unspecified: Secondary | ICD-10-CM | POA: Insufficient documentation

## 2020-01-23 DIAGNOSIS — E785 Hyperlipidemia, unspecified: Secondary | ICD-10-CM | POA: Insufficient documentation

## 2020-01-23 DIAGNOSIS — Z20822 Contact with and (suspected) exposure to covid-19: Secondary | ICD-10-CM | POA: Insufficient documentation

## 2020-01-23 DIAGNOSIS — Z880 Allergy status to penicillin: Secondary | ICD-10-CM | POA: Diagnosis not present

## 2020-01-23 DIAGNOSIS — K5720 Diverticulitis of large intestine with perforation and abscess without bleeding: Secondary | ICD-10-CM | POA: Diagnosis not present

## 2020-01-23 DIAGNOSIS — Z888 Allergy status to other drugs, medicaments and biological substances status: Secondary | ICD-10-CM | POA: Diagnosis not present

## 2020-01-23 HISTORY — DX: Diverticulitis of intestine, part unspecified, without perforation or abscess without bleeding: K57.92

## 2020-01-23 LAB — COMPREHENSIVE METABOLIC PANEL
ALT: 25 U/L (ref 0–44)
AST: 22 U/L (ref 15–41)
Albumin: 4.1 g/dL (ref 3.5–5.0)
Alkaline Phosphatase: 66 U/L (ref 38–126)
Anion gap: 12 (ref 5–15)
BUN: 18 mg/dL (ref 8–23)
CO2: 23 mmol/L (ref 22–32)
Calcium: 9.2 mg/dL (ref 8.9–10.3)
Chloride: 103 mmol/L (ref 98–111)
Creatinine, Ser: 1.48 mg/dL — ABNORMAL HIGH (ref 0.61–1.24)
GFR calc Af Amer: 53 mL/min — ABNORMAL LOW (ref >60)
GFR calc non Af Amer: 46 mL/min — ABNORMAL LOW (ref >60)
Glucose, Bld: 132 mg/dL — ABNORMAL HIGH (ref 70–99)
Potassium: 3.8 mmol/L (ref 3.5–5.1)
Sodium: 138 mmol/L (ref 135–145)
Total Bilirubin: 1.3 mg/dL — ABNORMAL HIGH (ref 0.3–1.2)
Total Protein: 7.7 g/dL (ref 6.5–8.1)

## 2020-01-23 LAB — URINALYSIS, ROUTINE W REFLEX MICROSCOPIC
Bacteria, UA: NONE SEEN
Bilirubin Urine: NEGATIVE
Glucose, UA: NEGATIVE mg/dL
Hgb urine dipstick: NEGATIVE
Ketones, ur: NEGATIVE mg/dL
Leukocytes,Ua: NEGATIVE
Nitrite: NEGATIVE
Protein, ur: 30 mg/dL — AB
Specific Gravity, Urine: 1.017 (ref 1.005–1.030)
pH: 7 (ref 5.0–8.0)

## 2020-01-23 LAB — SARS CORONAVIRUS 2 (TAT 6-24 HRS): SARS Coronavirus 2: NEGATIVE

## 2020-01-23 LAB — CBC
HCT: 43.7 % (ref 39.0–52.0)
Hemoglobin: 14.4 g/dL (ref 13.0–17.0)
MCH: 30.3 pg (ref 26.0–34.0)
MCHC: 33 g/dL (ref 30.0–36.0)
MCV: 92 fL (ref 80.0–100.0)
Platelets: 223 10*3/uL (ref 150–400)
RBC: 4.75 MIL/uL (ref 4.22–5.81)
RDW: 13.4 % (ref 11.5–15.5)
WBC: 11.1 10*3/uL — ABNORMAL HIGH (ref 4.0–10.5)
nRBC: 0 % (ref 0.0–0.2)

## 2020-01-23 LAB — LIPASE, BLOOD: Lipase: 23 U/L (ref 11–51)

## 2020-01-23 MED ORDER — OXYCODONE HCL 5 MG PO TABS: 5.0000 mg | ORAL_TABLET | ORAL | Status: DC | PRN

## 2020-01-23 MED ORDER — IOHEXOL 300 MG/ML  SOLN
100.0000 mL | Freq: Once | INTRAMUSCULAR | Status: AC | PRN
  Administered 2020-01-23: 16:00:00 100 mL via INTRAVENOUS

## 2020-01-23 MED ORDER — CIPROFLOXACIN IN D5W 400 MG/200ML IV SOLN
400.0000 mg | Freq: Two times a day (BID) | INTRAVENOUS | Status: DC
  Administered 2020-01-24: 10:00:00 400 mg via INTRAVENOUS
  Filled 2020-01-23: qty 200

## 2020-01-23 MED ORDER — SODIUM CHLORIDE 0.9 % IV SOLN: INTRAVENOUS | Status: DC

## 2020-01-23 MED ORDER — METRONIDAZOLE IN NACL 5-0.79 MG/ML-% IV SOLN
500.0000 mg | Freq: Once | INTRAVENOUS | Status: AC
  Administered 2020-01-23: 18:00:00 500 mg via INTRAVENOUS
  Filled 2020-01-23: qty 100

## 2020-01-23 MED ORDER — HYDROCHLOROTHIAZIDE 25 MG PO TABS
25.0000 mg | ORAL_TABLET | Freq: Every day | ORAL | Status: DC
  Administered 2020-01-24: 10:00:00 25 mg via ORAL
  Filled 2020-01-23: qty 1

## 2020-01-23 MED ORDER — SODIUM CHLORIDE 0.9% FLUSH: 3.0000 mL | Freq: Once | INTRAVENOUS | Status: DC

## 2020-01-23 MED ORDER — ENOXAPARIN SODIUM 40 MG/0.4ML ~~LOC~~ SOLN
40.0000 mg | SUBCUTANEOUS | Status: DC
  Administered 2020-01-23: 40 mg via SUBCUTANEOUS
  Filled 2020-01-23: qty 0.4

## 2020-01-23 MED ORDER — MORPHINE SULFATE (PF) 2 MG/ML IV SOLN: 1.0000 mg | INTRAVENOUS | Status: DC | PRN

## 2020-01-23 MED ORDER — LOSARTAN POTASSIUM 50 MG PO TABS
100.0000 mg | ORAL_TABLET | Freq: Every day | ORAL | Status: DC
  Administered 2020-01-24: 10:00:00 100 mg via ORAL
  Filled 2020-01-23: qty 2

## 2020-01-23 MED ORDER — SODIUM CHLORIDE 0.9 % IV BOLUS (SEPSIS)
1000.0000 mL | Freq: Once | INTRAVENOUS | Status: AC
  Administered 2020-01-23: 15:00:00 1000 mL via INTRAVENOUS

## 2020-01-23 MED ORDER — CIPROFLOXACIN IN D5W 400 MG/200ML IV SOLN
400.0000 mg | Freq: Once | INTRAVENOUS | Status: AC
  Administered 2020-01-23: 18:00:00 400 mg via INTRAVENOUS
  Filled 2020-01-23: qty 200

## 2020-01-23 MED ORDER — METRONIDAZOLE IN NACL 5-0.79 MG/ML-% IV SOLN
500.0000 mg | Freq: Three times a day (TID) | INTRAVENOUS | Status: DC
  Administered 2020-01-24: 500 mg via INTRAVENOUS
  Filled 2020-01-23: qty 100

## 2020-01-23 NOTE — ED Triage Notes (Signed)
C/o LLQ pain since Thursday with constipation.  Denies nausea and vomiting. States he has diverticulitis.

## 2020-01-23 NOTE — ED Provider Notes (Signed)
[facility] EMERGENCY DEPARTMENT Provider Note   CSN: [id number] Arrival date & time: 01/23/20  1218     History Chief Complaint  Patient presents with  . Abdominal Pain    Darryl Powers is a 74 y.o. male.  Patient is a 74 y/o male with PMH diverticulitis presenting to the RE for llq pain which started Thursday night. He reports this feels similar to diverticulitis flare he had 4 years ago.  He reports his last bowel movement was Friday night and was hard and he feels constipated.  He reports the pain comes and goes and is rated 4 out of 10 in the left lower quadrant.  Denies any blood in the stool, nausea, vomiting.  Reports he had a temperature 100.1 last night.  Has not tried anything for relief.        Past Medical History:  Diagnosis Date  . Diverticulitis   . Hypertension     Patient Active Problem List   Diagnosis Date Noted  . Acute kidney injury ([place])   . Diverticulitis 01/23/2020    Past Surgical History:  Procedure Laterality Date  . TENDON REPAIR Left 04/13/2016   Procedure: REPAIR TENDON NERVE INDEX AND LONG FINGER;  Surgeon: Dayna Barker, MD;  Location: [place];  Service: Plastics;  Laterality: Left;       No family history on file.  Social History   Tobacco Use  . Smoking status: Never Smoker  . Smokeless tobacco: Never Used  Substance Use Topics  . Alcohol use: Never  . Drug use: Never    Home Medications Prior to Admission medications   Medication Sig Start Date End Date Taking? Authorizing Provider  losartan-hydrochlorothiazide (HYZAAR) 100-25 MG tablet Take 1 tablet by mouth every morning. 04/08/16  Yes [provider]  Polyethyl Glycol-Propyl Glycol (SYSTANE OP) Place 1 drop into both eyes daily as needed (For dry eyes).   Yes [provider]  pravastatin (PRAVACHOL) 20 MG tablet Take 20 mg by mouth daily.   Yes [provider]  ciprofloxacin (CIPRO) 500 MG tablet Take 1 tablet (500 mg total) by  mouth 2 (two) times daily for 8 days. 01/25/20 02/02/20  Mullis, Kiersten P, DO  metroNIDAZOLE (FLAGYL) 500 MG tablet Take 1 tablet (500 mg total) by mouth every 8 (eight) hours for 8 days. Start taking evening of 4/12. 01/24/20 02/01/20  Mullis, Kiersten P, DO  polyethylene glycol (MIRALAX) 17 g packet Take 17 g by mouth daily. 01/24/20   Mullis, Kiersten P, DO    Allergies    Ace inhibitors and Amoxicillin-pot clavulanate  Review of Systems   Review of Systems  Constitutional: Negative for appetite change, chills and fever.  HENT: Negative for congestion and sore throat.   Respiratory: Negative for cough and shortness of breath.   Cardiovascular: Negative for chest pain, palpitations and leg swelling.  Gastrointestinal: Positive for abdominal pain. Negative for anal bleeding, blood in stool, diarrhea, nausea and vomiting.  Genitourinary: Negative for dysuria and hematuria.  Musculoskeletal: Negative for back pain.  Skin: Negative for rash.  Neurological: Negative for dizziness.    Physical Exam Updated Vital Signs BP 128/73 (BP Location: Left Arm)   Pulse (!) 54   Temp 97.8 F (36.6 C) (Oral)   Resp 19   Ht 5\' 9"  (1.753 m)   Wt 87.1 kg   SpO2 91%   BMI 28.35 kg/m   Physical Exam Vitals and nursing note reviewed.  Constitutional:  General: He is not in acute distress.    Appearance: Normal appearance. He is well-developed. He is not ill-appearing, toxic-appearing or diaphoretic.  HENT:     Head: Normocephalic.  Eyes:     Conjunctiva/sclera: Conjunctivae normal.  Cardiovascular:     Rate and Rhythm: Normal rate and regular rhythm.  Pulmonary:     Effort: Pulmonary effort is normal.  Abdominal:     General: Bowel sounds are decreased.     Palpations: Abdomen is soft.     Tenderness: There is abdominal tenderness in the left lower quadrant. There is guarding.  Skin:    General: Skin is warm and dry.  Neurological:     Mental Status: He is alert.  Psychiatric:         Mood and Affect: Mood normal.     ED Results / Procedures / Treatments   Labs (all labs ordered are listed, but only abnormal results are displayed) Labs Reviewed  COMPREHENSIVE METABOLIC PANEL - Abnormal; Notable for the following components:      Result Value   Glucose, Bld 132 (*)    Creatinine, Ser 1.48 (*)    Total Bilirubin 1.3 (*)    GFR calc non Af Amer 46 (*)    GFR calc Af Amer 53 (*)    All other components within normal limits  CBC - Abnormal; Notable for the following components:   WBC 11.1 (*)    All other components within normal limits  URINALYSIS, ROUTINE W REFLEX MICROSCOPIC - Abnormal; Notable for the following components:   Protein, ur 30 (*)    All other components within normal limits  BASIC METABOLIC PANEL - Abnormal; Notable for the following components:   Glucose, Bld 123 (*)    Creatinine, Ser 1.37 (*)    Calcium 8.3 (*)    GFR calc non Af Amer 50 (*)    GFR calc Af Amer 58 (*)    All other components within normal limits  CBC - Abnormal; Notable for the following components:   RBC 4.00 (*)    Hemoglobin 12.2 (*)    HCT 36.7 (*)    All other components within normal limits  SARS CORONAVIRUS 2 (TAT 6-24 HRS)  LIPASE, BLOOD  HEMOGLOBIN A1C    EKG None  Radiology CT ABDOMEN PELVIS W CONTRAST  Result Date: 01/23/2020 CLINICAL DATA:  Abdominal pain EXAM: CT ABDOMEN AND PELVIS WITH CONTRAST TECHNIQUE: Multidetector CT imaging of the abdomen and pelvis was performed using the standard protocol following bolus administration of intravenous contrast. CONTRAST:  180mL OMNIPAQUE IOHEXOL 300 MG/ML  SOLN COMPARISON:  05/19/2007 FINDINGS: Lower chest: No acute abnormality. Hepatobiliary: Mildly diffusely decreased attenuation of the hepatic parenchyma suggesting hepatic steatosis. No focal hepatic lesion. Gallbladder is unremarkable. No hyperdense gallstone. No biliary dilatation. Pancreas: Unremarkable. No pancreatic ductal dilatation or surrounding  inflammatory changes. Spleen: Normal in size without focal abnormality. Adrenals/Urinary Tract: Adrenal glands are unremarkable. Kidneys are normal, without renal calculi, focal lesion, or hydronephrosis. Bladder is unremarkable. Stomach/Bowel: Short segment circumferential bowel wall thickening of the proximal sigmoid colon with pericolonic fat stranding and trace free fluid. Contained micro perforation suspected along the anterior wall with surrounding fluid (series 3, image 68). Numerous sigmoid diverticula. No free air or well-defined fluid collection. No dilated loops of bowel to suggest obstruction. A normal appendix is present within the right lower quadrant. Stomach and small bowel within normal limits. Vascular/Lymphatic: Minimal scattered atherosclerotic calcification. No aneurysm. No abdominopelvic lymphadenopathy. Reproductive: Prostate is unremarkable.  Other: Small fat containing left inguinal hernia. Trace fluid in the left pericolic gutter. No abscess. No pneumoperitoneum. Musculoskeletal: No acute or significant osseous findings. IMPRESSION: 1. Acute sigmoid diverticulitis with suspected contained microperforation. No free intraperitoneal air. No well-defined abscess. 2. Hepatic steatosis. 3. Small fat containing left inguinal hernia. These results were called by telephone at the time of interpretation on 01/23/2020 at 4:21 pm to provider [facility] , who verbally acknowledged these results. Electronically Signed   By: Davina Poke D.O.   On: 01/23/2020 16:21    Procedures Procedures (including critical care time)  Medications Ordered in ED Medications  sodium chloride flush (NS) 0.9 % injection 3 mL (has no administration in time range)  losartan (COZAAR) tablet 100 mg (100 mg Oral Given 01/24/20 0930)  hydrochlorothiazide (HYDRODIURIL) tablet 25 mg (25 mg Oral Given 01/24/20 0930)  enoxaparin (LOVENOX) injection 40 mg (40 mg Subcutaneous Given 01/23/20 2053)  0.9 %  sodium chloride  infusion ( Intravenous Rate/Dose Verify 01/24/20 1051)  oxyCODONE (Oxy IR/ROXICODONE) immediate release tablet 5 mg (has no administration in time range)  ciprofloxacin (CIPRO) tablet 500 mg (500 mg Oral Given 01/24/20 1113)  metroNIDAZOLE (FLAGYL) tablet 500 mg (500 mg Oral Given 01/24/20 1500)  sodium chloride 0.9 % bolus 1,000 mL (0 mLs Intravenous Stopped 01/23/20 1755)  iohexol (OMNIPAQUE) 300 MG/ML solution 100 mL (100 mLs Intravenous Contrast Given 01/23/20 1531)  ciprofloxacin (CIPRO) IVPB 400 mg (0 mg Intravenous Stopped 01/23/20 1909)    And  metroNIDAZOLE (FLAGYL) IVPB 500 mg (0 mg Intravenous Stopped 01/23/20 1909)    ED Course  I have reviewed the triage vital signs and the nursing notes.  Pertinent labs & imaging results that were available during my care of the patient were reviewed by me and considered in my medical decision making (see chart for details).  Clinical Course as of Jan 23 1522  Sun Jan 23, 2020  1330 Elderly male with hx diverticulitis presenting with LLQ pain. Overall well appearing on arrival with normal vitals. Pending labs and CT   [KM]  [id number] CT scan reveals Sigmoid diverticulitis w/ contained perforation. Patient well apearing and not septic. Will start IV abx and admit to hospitalist   [KM]    Clinical Course User Index [KM] Kristine Royal   MDM Rules/Calculators/A&P                      The patient appears reasonably stabilized for admission considering the current resources, flow, and capabilities available in the ED at this time, and I doubt any other [facility] requiring further screening and/or treatment in the ED prior to admission.  Final Clinical Impression(s) / ED Diagnoses Final diagnoses:  Diverticulitis    Rx / DC Orders ED Discharge Orders         Ordered    ciprofloxacin (CIPRO) 500 MG tablet  2 times daily     01/24/20 1412    metroNIDAZOLE (FLAGYL) 500 MG tablet  Every 8 hours     01/24/20 1412    polyethylene glycol (MIRALAX) 17  g packet  Daily     01/24/20 1412    Discharge patient    Comments: Patient to get dose of Metronidazole prior to discharge.   01/24/20 1412           Alveria Apley, PA-C 01/24/20 1523    Lacretia Leigh, MD 01/25/20 [phone]

## 2020-01-23 NOTE — ED Provider Notes (Signed)
Medical screening examination/treatment/procedure(s) were conducted as a shared visit with non-physician practitioner(s) and myself.  I personally evaluated the patient during the encounter.    74 year old male here complaining of left lower quadrant abdominal pain.  CT scan shows diverticulitis with small perforation.  Will start patient on antibiotics and he will be admitted to the hospital   Lacretia Leigh, MD 01/23/20 1626

## 2020-01-23 NOTE — H&P (Signed)
[facility] Admission History and Physical Service Pager: [phone]  Patient name: Darryl Powers Medical record number: [id number] Date of birth: 04/01/1946 Age: 74 y.o. Gender: male  Primary Care Provider: System, Provider Not In Consultants: None Code Status: Full Code  Preferred Emergency Contact: Darryl Powers, wife, [phone]  Chief Complaint: Abdominal pain   Assessment and Plan: Darryl Powers is a 74 y.o. male presenting with LLQ abdominal pain, found to have acute diverticulitis. PMH is significant for hypertension and hyperlipidemia.   Acute uncomplicated diverticulitis, with microperforation: Stable. 4 day h/o LLQ abdominal pain, consistent with previous diverticulitis flares.  Currently fourth episode he has experienced, first requiring hospitalization.  CT abdomen showing sigmoid diverticulitis with microperforation, no abscess collection. Mild leukocytosis of 11.1.  U/a clear, lipase and liver function WNL.  Reassuringly well-appearing on exam, tender in LLQ but nonsurgical abdomen.  Will admit for IV antibiotics and hydration, suspect if he continues to improve will likely be able to discharge home tomorrow or the next day. -Admit to [facility], attending Dr. Gwendlyn Deutscher -Continue IV ciprofloxacin and Flagyl -IVF NS at 125 ML/hour -Clear liquid diet as tolerated -Pain control: Tylenol for mild, oxycodone 5 mg PRN for moderate, IV morphine 1 mg PRN for severe -Monitor CBC, BMP, vitals per routine -Recommend follow-up with GI outpatient given recurrent diverticulitis, no acute indication for surgical or GI evaluation during current hospitalization -Encourage high-fiber diet on discharge  Hypertension: chronic, stable.  SBP average 120s since admit.  Takes losartan-HCTZ at home, took prior to arrival.  CR 1.48 on admit with unclear baseline, likely will continue home medication however will reassess creatinine in the a.m. -Monitor BP -Likely continue home  losartan-HCTZ on 4/12, however may hold if CR not improving  Hyperlipidemia: Chronic, stable. Primary prevention. -Continue home pravastatin  Elevated Creatinine: Acute.  CR 1.48, GFR 46.  Unclear baseline, last Cr within system in 2010, 1.07 at that time.  Suspect may be AKI in the setting of acute illness as discussed above, will monitor. -Monitor BP -IVF as above  FEN/GI: clear liquid diet  Prophylaxis: Lovenox   Disposition: Admit to med surg, discharge pending clinical improvement, hopefully 1-2 days  History of Present Illness:  Darryl Powers is a 74 y.o. male with a history of diverticulitis presenting with acute onset of left lower quadrant abdominal pain for the past 4 days.  He reports he started having LLQ abdominal pain starting Thursday morning associate with constipation.  This seemed typical of his previous diverticulitis flares.  He noted this pain which was stabbing in nature started to progress through Thursday and Saturday.  Due to this, he was unable to sleep last night and could not wait until Monday to see his primary care provider.  Reports 100.44F temperature yesterday evening.  He has been able to tolerate some water, last ate yesterday evening.  Last BM on Friday, 4/9, hard stool at that time.  Denies any associated emesis, nausea.  Reports this is now his fourth episode of diverticulitis, last episode was approximately 4 years ago.  Has never required hospitalization.  Does not follow with GI.  ED: On arrival he was hemodynamically stable in no acute distress.  CT abdomen obtained showing acute sigmoid diverticulitis with suspected contained microperforation without abscess and a fat-containing left inguinal hernia.  Labs significant for creatinine of 1.48, bilirubin 1.3, leukocytosis of 11.1.  He was started on IV Cipro and Flagyl.   Review Of Systems: Per HPI with the following additions:  Review of Systems  Constitutional: Negative for chills and fever.   Respiratory: Negative for cough, sputum production and shortness of breath.   Cardiovascular: Negative for chest pain.  Gastrointestinal: Positive for abdominal pain and constipation. Negative for blood in stool, melena, nausea and vomiting.  Genitourinary: Negative for dysuria and flank pain.  Musculoskeletal: Negative for myalgias.  Neurological: Negative for dizziness and weakness.    Patient Active Problem List   Diagnosis Date Noted  . Diverticulitis 01/23/2020    Past Medical History: Past Medical History:  Diagnosis Date  . Diverticulitis   . Hypertension     Past Surgical History: Past Surgical History:  Procedure Laterality Date  . TENDON REPAIR Left 04/13/2016   Procedure: REPAIR TENDON NERVE INDEX AND LONG FINGER;  Surgeon: Dayna Barker, MD;  Location: [place];  Service: Plastics;  Laterality: Left;    Social History: Social History   Tobacco Use  . Smoking status: Never Smoker  . Smokeless tobacco: Never Used  Substance Use Topics  . Alcohol use: Never  . Drug use: Never   Additional social history: Lives with wife, non-smoker  Please also refer to relevant sections of EMR.  Family History: No family history on file.   Allergies and Medications: Allergies  Allergen Reactions  . Ace Inhibitors Cough  . Amoxicillin-Pot Clavulanate Rash   No current facility-administered medications on file prior to encounter.   Current Outpatient Medications on File Prior to Encounter  Medication Sig Dispense Refill  . losartan-hydrochlorothiazide (HYZAAR) 100-25 MG tablet Take 1 tablet by mouth every morning.  1  . Polyethyl Glycol-Propyl Glycol (SYSTANE OP) Place 1 drop into both eyes daily as needed (For dry eyes).    . pravastatin (PRAVACHOL) 20 MG tablet Take 20 mg by mouth daily.      Objective: BP 136/70 (BP Location: Left Arm)   Pulse (!) 58   Temp 98.5 F (36.9 C) (Oral)   Resp 18   Ht 5\' 9"  (1.753 m)   Wt 87.1 kg   SpO2 100%   BMI 28.35 kg/m   Exam: General: Alert, NAD, older gentleman HEENT: NCAT, MMM Cardiac: RRR no m/g/r Lungs: Clear bilaterally, no increased WOB  Abdomen: soft, significantly tender to palpation of LLQ without rebounding or guarding, nondistended, decreased bowel sounds throughout Msk: Moves all extremities spontaneously  Ext: Warm, dry, 2+ distal pulses, no edema  Psych: Appropriate mood and affect, engages in conversation Neuro: Alert and oriented, follows commands appropriately, no focal neuro deficits noted Derm: No rashes noted  Labs and Imaging: CBC BMET  Recent Labs  Lab 01/23/20 1244  WBC 11.1*  HGB 14.4  HCT 43.7  PLT 223   Recent Labs  Lab 01/23/20 1244  NA 138  K 3.8  CL 103  CO2 23  BUN 18  CREATININE 1.48*  GLUCOSE 132*  CALCIUM 9.2     CT ABDOMEN PELVIS W CONTRAST  Result Date: 01/23/2020 CLINICAL DATA:  Abdominal pain EXAM: CT ABDOMEN AND PELVIS WITH CONTRAST TECHNIQUE: Multidetector CT imaging of the abdomen and pelvis was performed using the standard protocol following bolus administration of intravenous contrast. CONTRAST:  174mL OMNIPAQUE IOHEXOL 300 MG/ML  SOLN COMPARISON:  05/19/2007 FINDINGS: Lower chest: No acute abnormality. Hepatobiliary: Mildly diffusely decreased attenuation of the hepatic parenchyma suggesting hepatic steatosis. No focal hepatic lesion. Gallbladder is unremarkable. No hyperdense gallstone. No biliary dilatation. Pancreas: Unremarkable. No pancreatic ductal dilatation or surrounding inflammatory changes. Spleen: Normal in size without focal abnormality. Adrenals/Urinary Tract: Adrenal glands are  unremarkable. Kidneys are normal, without renal calculi, focal lesion, or hydronephrosis. Bladder is unremarkable. Stomach/Bowel: Short segment circumferential bowel wall thickening of the proximal sigmoid colon with pericolonic fat stranding and trace free fluid. Contained micro perforation suspected along the anterior wall with surrounding fluid (series 3, image  68). Numerous sigmoid diverticula. No free air or well-defined fluid collection. No dilated loops of bowel to suggest obstruction. A normal appendix is present within the right lower quadrant. Stomach and small bowel within normal limits. Vascular/Lymphatic: Minimal scattered atherosclerotic calcification. No aneurysm. No abdominopelvic lymphadenopathy. Reproductive: Prostate is unremarkable. Other: Small fat containing left inguinal hernia. Trace fluid in the left pericolic gutter. No abscess. No pneumoperitoneum. Musculoskeletal: No acute or significant osseous findings. IMPRESSION: 1. Acute sigmoid diverticulitis with suspected contained microperforation. No free intraperitoneal air. No well-defined abscess. 2. Hepatic steatosis. 3. Small fat containing left inguinal hernia. These results were called by telephone at the time of interpretation on 01/23/2020 at 4:21 pm to provider [facility] , who verbally acknowledged these results. Electronically Signed   By: Davina Poke D.O.   On: 01/23/2020 16:21    Patriciaann Clan, DO 01/23/2020, 7:33 PM PGY-2, [place] Intern pager: [phone], text pages welcome

## 2020-01-24 DIAGNOSIS — N179 Acute kidney failure, unspecified: Secondary | ICD-10-CM

## 2020-01-24 DIAGNOSIS — K5792 Diverticulitis of intestine, part unspecified, without perforation or abscess without bleeding: Secondary | ICD-10-CM | POA: Diagnosis not present

## 2020-01-24 LAB — BASIC METABOLIC PANEL
Anion gap: 11 (ref 5–15)
BUN: 15 mg/dL (ref 8–23)
CO2: 23 mmol/L (ref 22–32)
Calcium: 8.3 mg/dL — ABNORMAL LOW (ref 8.9–10.3)
Chloride: 104 mmol/L (ref 98–111)
Creatinine, Ser: 1.37 mg/dL — ABNORMAL HIGH (ref 0.61–1.24)
GFR calc Af Amer: 58 mL/min — ABNORMAL LOW (ref >60)
GFR calc non Af Amer: 50 mL/min — ABNORMAL LOW (ref >60)
Glucose, Bld: 123 mg/dL — ABNORMAL HIGH (ref 70–99)
Potassium: 3.6 mmol/L (ref 3.5–5.1)
Sodium: 138 mmol/L (ref 135–145)

## 2020-01-24 LAB — CBC
HCT: 36.7 % — ABNORMAL LOW (ref 39.0–52.0)
Hemoglobin: 12.2 g/dL — ABNORMAL LOW (ref 13.0–17.0)
MCH: 30.5 pg (ref 26.0–34.0)
MCHC: 33.2 g/dL (ref 30.0–36.0)
MCV: 91.8 fL (ref 80.0–100.0)
Platelets: 169 10*3/uL (ref 150–400)
RBC: 4 MIL/uL — ABNORMAL LOW (ref 4.22–5.81)
RDW: 13.6 % (ref 11.5–15.5)
WBC: 6.8 10*3/uL (ref 4.0–10.5)
nRBC: 0 % (ref 0.0–0.2)

## 2020-01-24 MED ORDER — POLYETHYLENE GLYCOL 3350 17 G PO PACK: 17.0000 g | PACK | Freq: Every day | ORAL | 0 refills | Status: DC

## 2020-01-24 MED ORDER — CIPROFLOXACIN HCL 500 MG PO TABS: 500.0000 mg | ORAL_TABLET | Freq: Two times a day (BID) | ORAL | 0 refills | Status: AC

## 2020-01-24 MED ORDER — METRONIDAZOLE 500 MG PO TABS
500.0000 mg | ORAL_TABLET | Freq: Three times a day (TID) | ORAL | Status: DC
  Administered 2020-01-24: 15:00:00 500 mg via ORAL
  Filled 2020-01-24: qty 1

## 2020-01-24 MED ORDER — METRONIDAZOLE 500 MG PO TABS: 500.0000 mg | ORAL_TABLET | Freq: Three times a day (TID) | ORAL | 0 refills | Status: AC

## 2020-01-24 MED ORDER — CIPROFLOXACIN HCL 500 MG PO TABS
500.0000 mg | ORAL_TABLET | Freq: Two times a day (BID) | ORAL | Status: DC
  Administered 2020-01-24: 500 mg via ORAL
  Filled 2020-01-24: qty 1

## 2020-01-24 NOTE — Progress Notes (Signed)
Darryl Powers to be D/C'd home per MD order.  Discussed prescriptions and follow up appointments with the patient. Prescriptions sent to patient's preferred pharmacy, medication list explained in detail. Pt verbalized understanding.  Allergies as of 01/24/2020      Reactions   Ace Inhibitors Cough   Amoxicillin-pot Clavulanate Rash      Medication List    TAKE these medications   ciprofloxacin 500 MG tablet Commonly known as: CIPRO Take 1 tablet (500 mg total) by mouth 2 (two) times daily for 8 days. Start taking on: January 25, 2020   losartan-hydrochlorothiazide 100-25 MG tablet Commonly known as: HYZAAR Take 1 tablet by mouth every morning.   metroNIDAZOLE 500 MG tablet Commonly known as: FLAGYL Take 1 tablet (500 mg total) by mouth every 8 (eight) hours for 8 days. Start taking evening of 4/12.   polyethylene glycol 17 g packet Commonly known as: MiraLax Take 17 g by mouth daily.   pravastatin 20 MG tablet Commonly known as: PRAVACHOL Take 20 mg by mouth daily.   SYSTANE OP Place 1 drop into both eyes daily as needed (For dry eyes).       Vitals:   01/24/20 0316 01/24/20 0816  BP: (!) 126/55 128/73  Pulse: (!) 59 (!) 54  Resp: 16 19  Temp: 98.2 F (36.8 C) 97.8 F (36.6 C)  SpO2: 92% 91%    Skin clean, dry and intact without evidence of skin break down, no evidence of skin tears noted. IV catheter discontinued intact. Site without signs and symptoms of complications. Dressing and pressure applied. Pt denies pain at this time. No complaints noted.  An After Visit Summary was printed and given to the patient. Patient escorted via [place], and D/C home via private auto.  [place] 01/24/2020 4:53 PM

## 2020-01-24 NOTE — Plan of Care (Signed)
  Problem: Education: Goal: Knowledge of General Education information will improve Description: Including pain rating scale, medication(s)/side effects and non-pharmacologic comfort measures Outcome: Completed/Met   Problem: Clinical Measurements: Goal: Will remain free from infection Outcome: Completed/Met Goal: Diagnostic test results will improve Outcome: Completed/Met   Problem: Activity: Goal: Risk for activity intolerance will decrease Outcome: Completed/Met   Problem: Nutrition: Goal: Adequate nutrition will be maintained Outcome: Completed/Met   Problem: Elimination: Goal: Will not experience complications related to bowel motility Outcome: Completed/Met Goal: Will not experience complications related to urinary retention Outcome: Completed/Met   Problem: Pain Managment: Goal: General experience of comfort will improve Outcome: Completed/Met   Problem: Safety: Goal: Ability to remain free from injury will improve Outcome: Completed/Met

## 2020-01-24 NOTE — Discharge Instructions (Signed)
Thank you so much for allowing [place] to be part of your care!  You presented to the ED due to abdominal pain, you are found to have acute diverticulitis with a small microperforation (a small amount of gas leaked into the wall of your colon, however no air is out in your abdomen).  Fortunately you are able to do quite well with IV antibiotics and you switched to an oral regimen on 4/12.  We have sent in ciprofloxacin and Flagyl into your pharmacy which would like you to complete for 8 additional days.  We encourage you to advance your diet back to normal slowly as tolerated, make sure you are very well-hydrated.  A high-fiber diet will be important to possibly help prevent further flares in the future.  Antibiotics: Ciprofloxacin: You will need to take 1 tablet twice a day until completion. You will start tomorrow (4/13). Metronidazole: You will need to take 1 tablet every 8 hours until completion. Your first dose will be this evening (4/12). Please refrain from drinking will taking Metronidazole as it can cause side effects.   Lastly, given the amount of flares of diverticulitis that you have had in the past we do think it be reasonable to follow-up with GI (stomach doctors) outpatient, please discuss this with your primary care provider on follow-up.  We also recommend you follow-up with your PCP in the next 1-2 weeks.  If you have recurrence of severe abdominal pain, nausea/bowel movement changes, persistent fever please seek medical care.

## 2020-01-24 NOTE — Plan of Care (Signed)
  Problem: Education: Goal: Knowledge of General Education information will improve Description Including pain rating scale, medication(s)/side effects and non-pharmacologic comfort measures Outcome: Progressing   

## 2020-01-24 NOTE — Discharge Summary (Signed)
[facility] Discharge Summary  Patient name: Darryl Powers Medical record number: [id number] Date of birth: 29-Jul-1946 Age: 74 y.o. Gender: male Date of Admission: 01/23/2020  Date of Discharge: 01/24/2020 Admitting Physician: Stark Klein, MD  Primary Care Provider: System, Provider Not In Consultants: None  Indication for Hospitalization: Abdominal PAin  Discharge Diagnoses/Problem List:  Acute Diverticulitis with microperforation HTN Hyperlipidemia Elevated Creatinine  Disposition: Home   Discharge Condition: Stable  Discharge Exam: 01/24/2020 General: Alert and oriented, no apparent distress      Cardiovascular: RRR with no murmurs noted Respiratory: CTA bilaterally     Gastrointestinal: Bowel sounds present.  Pain elicited on palpation at left lower quadrant. Extremities: Moves all extremities, no lower extremity edema             Brief Hospital Course:  Darryl Powers a 74 y.o.malepresenting with LLQ abdominal pain, found to have acute diverticulitis. PMH is significant forhypertension and hyperlipidemia.   Acute Diverticulitis Patient presented to ED after 4-day history of left lower quadrant abdominal pain.  Has had previous diverticulitis flares but never admitted to hospital.  Vital signs stable on admission and afebrile.  CT abdomen impressive for acute sigmoid diverticulitis with suspected microperforation.  No free intraperitoneal air or abscess noted.  Hepatic steatosis also seen.  Labs consistent for mild AKI thought to be due to dehydration secondary to abdominal pain.  Mild leukocytosis. urinalysis negative for UTI.  Covid negative.  He was started on IV Cipro and Flagyl given IV fluids.   He continued to improve and was tolerating p.o. clear fluids on the day of discharge.  IV antibiotics transition to p.o. for 10-day course.  Repeat creatinine 1.37 down from 1.48.  Repeat WBC is within normal limits.  On the day of discharge  his pain had significantly decreased and was feeling good.  Issues for Follow Up:  1. Will need ambulatory referral for GI for further evaluation. 2. Continue Ciprofloxacin and Flagyl for 10 day course (04/11-04/21) 3. Encourage High Fiber Diet  Significant Procedures: None  Significant Labs and Imaging:  Recent Labs  Lab 01/23/20 1244 01/24/20 0459  WBC 11.1* 6.8  HGB 14.4 12.2*  HCT 43.7 36.7*  PLT 223 169   Recent Labs  Lab 01/23/20 1244 01/24/20 0459  NA 138 138  K 3.8 3.6  CL 103 104  CO2 23 23  GLUCOSE 132* 123*  BUN 18 15  CREATININE 1.48* 1.37*  CALCIUM 9.2 8.3*  ALKPHOS 66  --   AST 22  --   ALT 25  --   ALBUMIN 4.1  --       Results/Tests Pending at Time of Discharge: CT ABDOMEN PELVIS W CONTRAST  Result Date: 01/23/2020 CLINICAL DATA:  Abdominal pain EXAM: CT ABDOMEN AND PELVIS WITH CONTRAST TECHNIQUE:  IMPRESSION: 1. Acute sigmoid diverticulitis with suspected contained microperforation. No free intraperitoneal air. No well-defined abscess. 2. Hepatic steatosis. 3. Small fat containing left inguinal hernia. These results were called by telephone at the time of interpretation on 01/23/2020 at 4:21 pm to provider [employer] , who verbally acknowledged these results. Electronically Signed   By: Davina Poke D.O.   On: 01/23/2020 16:21     Discharge Medications:  Allergies as of 01/24/2020      Reactions   Ace Inhibitors Cough   Amoxicillin-pot Clavulanate Rash      Medication List    TAKE these medications   ciprofloxacin 500 MG tablet Commonly known as: CIPRO Take  1 tablet (500 mg total) by mouth 2 (two) times daily for 8 days. Start taking on: January 25, 2020   losartan-hydrochlorothiazide 100-25 MG tablet Commonly known as: HYZAAR Take 1 tablet by mouth every morning.   metroNIDAZOLE 500 MG tablet Commonly known as: FLAGYL Take 1 tablet (500 mg total) by mouth every 8 (eight) hours for 8 days. Start taking evening of 4/12.    polyethylene glycol 17 g packet Commonly known as: MiraLax Take 17 g by mouth daily.   pravastatin 20 MG tablet Commonly known as: PRAVACHOL Take 20 mg by mouth daily.   SYSTANE OP Place 1 drop into both eyes daily as needed (For dry eyes).       Discharge Instructions: Please refer to Patient Instructions section of EMR for full details.  Patient was counseled important signs and symptoms that should prompt return to medical care, changes in medications, dietary instructions, activity restrictions, and follow up appointments.   Follow-Up Appointments: Follow-up Information    [facility]. Schedule an appointment as soon as possible for a visit.   Why: Please schedule hospital follow-up in 1-2 weeks.           Carollee Leitz, MD 01/24/2020, 2:44 PM PGY-1, [place]

## 2020-01-25 ENCOUNTER — Telehealth: Payer: Self-pay | Admitting: Family Medicine

## 2020-01-25 LAB — HEMOGLOBIN A1C
Hgb A1c MFr Bld: 6 % — ABNORMAL HIGH (ref 4.8–5.6)
Mean Plasma Glucose: 126 mg/dL

## 2020-01-25 NOTE — Telephone Encounter (Signed)
I was unable to reach him on both numbers listed on file for him. HIPAA compliant callback message left.  Should he call back, please advise:  A1C elevated at 6 suggestive of Pre-DM.  He will benefit from diet change and exercise for weight loss.  Have him follow-up with PCP soon for further management discussion.

## 2020-06-14 DIAGNOSIS — I4891 Unspecified atrial fibrillation: Secondary | ICD-10-CM | POA: Insufficient documentation

## 2020-06-14 DIAGNOSIS — K5730 Diverticulosis of large intestine without perforation or abscess without bleeding: Secondary | ICD-10-CM | POA: Insufficient documentation

## 2020-06-14 DIAGNOSIS — I499 Cardiac arrhythmia, unspecified: Secondary | ICD-10-CM | POA: Insufficient documentation

## 2020-06-28 ENCOUNTER — Other Ambulatory Visit: Payer: Self-pay

## 2020-06-28 ENCOUNTER — Ambulatory Visit: Payer: Medicare Other | Admitting: Cardiology

## 2020-06-28 ENCOUNTER — Other Ambulatory Visit: Payer: Self-pay | Admitting: Cardiology

## 2020-06-28 ENCOUNTER — Encounter: Payer: Self-pay | Admitting: Cardiology

## 2020-06-28 VITALS — BP 136/77 | HR 72 | Ht 69.0 in

## 2020-06-28 DIAGNOSIS — I4891 Unspecified atrial fibrillation: Secondary | ICD-10-CM

## 2020-06-28 DIAGNOSIS — E7800 Pure hypercholesterolemia, unspecified: Secondary | ICD-10-CM

## 2020-06-28 DIAGNOSIS — R7303 Prediabetes: Secondary | ICD-10-CM

## 2020-06-28 DIAGNOSIS — I129 Hypertensive chronic kidney disease with stage 1 through stage 4 chronic kidney disease, or unspecified chronic kidney disease: Secondary | ICD-10-CM

## 2020-06-28 DIAGNOSIS — Z7901 Long term (current) use of anticoagulants: Secondary | ICD-10-CM

## 2020-06-28 MED ORDER — METOPROLOL SUCCINATE ER 25 MG PO TB24: 25.0000 mg | ORAL_TABLET | Freq: Every morning | ORAL | 0 refills | Status: DC

## 2020-06-28 MED ORDER — LOSARTAN POTASSIUM 100 MG PO TABS: 100.0000 mg | ORAL_TABLET | Freq: Every evening | ORAL | 0 refills | Status: DC

## 2020-06-28 MED ORDER — HYDROCHLOROTHIAZIDE 12.5 MG PO TABS: 25.0000 mg | ORAL_TABLET | Freq: Every morning | ORAL | 0 refills | Status: DC

## 2020-06-28 NOTE — Progress Notes (Signed)
Date:  06/28/2020   ID:  IMIR BRUMBACH, DOB 01/07/1946, MRN [id number]  PCP:  Bartholome Bill, MD  Cardiologist:  Rex Kras, DO, [facility] (established care 06/28/2020)  REASON FOR CONSULT: Atrial Fibrillation.   REQUESTING PHYSICIAN:  Bartholome Bill, MD [address],  [place] [address]  Chief Complaint  Patient presents with  . Atrial Fibrillation    HPI  Darryl Powers is a 74 y.o. male who presents to the office with a chief complaint of " atrial fibrillation management." Patient's past medical history and cardiovascular risk factors include: Hypertension, hypercholesterolemia, chronic kidney disease stage III, prediabetes, advanced age.  He is referred to the office at the request of Bartholome Bill, MD for evaluation of atrial fibrillation.  Patient states that he went in for his yearly annual physical with his primary care provider and mentioned that when he works out on a treadmill or elliptical his heart rate that is registered by the machine is very chaotic.  They performed an EKG according to the patient was found to be in atrial fibrillation.  He was already started on oral anticoagulation prior to establishing care with myself.  He is not on any AV nodal blocking agents.  He denies any prior history of intracranial bleeding or GI bleeding.  No recent surgeries.  And patient states that since been on Eliquis he has not noticed any evidence of bleeding.  He started Eliquis approximately June 14, 2020.  No chest pain or shortness of breath at rest or with effort related activities.  He works out 3 days a week without any change in overall endurance.  At times patient states that when he changes positions quickly he does feel lightheaded and dizziness which shortly resolved within 1 minutes.  Denies prior history of coronary artery disease, myocardial infarction, congestive heart failure, deep venous thrombosis, pulmonary embolism, stroke,  transient ischemic attack.  FUNCTIONAL STATUS: Three days a week goes to the gym and works on Editor, commissioning, elliptical, and treadmill.    ALLERGIES: Allergies  Allergen Reactions  . Ace Inhibitors Cough  . Amoxicillin-Pot Clavulanate Rash    MEDICATION LIST PRIOR TO VISIT: Current Meds  Medication Sig  . ELIQUIS 5 MG TABS tablet Take 5 mg by mouth 2 (two) times daily.  . pravastatin (PRAVACHOL) 20 MG tablet Take 20 mg by mouth daily.  . psyllium (METAMUCIL SMOOTH TEXTURE) 28 % packet Take 1 packet by mouth 2 (two) times daily.  . [DISCONTINUED] losartan-hydrochlorothiazide (HYZAAR) 100-25 MG tablet Take 1 tablet by mouth every morning.  . [DISCONTINUED] losartan-hydrochlorothiazide (HYZAAR) 100-25 MG tablet Take 1 tablet by mouth daily.  . [DISCONTINUED] polyethylene glycol (MIRALAX) 17 g packet Take 17 g by mouth daily.     PAST MEDICAL HISTORY: Past Medical History:  Diagnosis Date  . Atrial fibrillation (Darryl Powers)   . Diverticulitis   . Hyperlipidemia   . Hypertension   . Melanoma ([name])     PAST SURGICAL HISTORY: Past Surgical History:  Procedure Laterality Date  . REDUCTION OF TORSION OF TESTIS    . TENDON REPAIR Left 04/13/2016   Procedure: REPAIR TENDON NERVE INDEX AND LONG FINGER;  Surgeon: Dayna Barker, MD;  Location: [facility];  Service: Plastics;  Laterality: Left;    FAMILY HISTORY: The patient family history includes Aneurysm in his father; Leukemia in his brother; Stroke in his mother.  SOCIAL HISTORY:  The patient  reports that he has never smoked. He has never  used smokeless tobacco. He reports that he does not drink alcohol and does not use drugs.  REVIEW OF SYSTEMS: Review of Systems  Constitutional: Negative for chills and fever.  HENT: Negative for hoarse voice and nosebleeds.   Eyes: Negative for discharge, double vision and pain.  Cardiovascular: Negative for chest pain, claudication, dyspnea on exertion, leg swelling, near-syncope, orthopnea,  palpitations, paroxysmal nocturnal dyspnea and syncope.  Respiratory: Negative for hemoptysis and shortness of breath.   Musculoskeletal: Negative for muscle cramps and myalgias.  Gastrointestinal: Negative for abdominal pain, constipation, diarrhea, hematemesis, hematochezia, melena, nausea and vomiting.  Neurological: Negative for dizziness and light-headedness.   PHYSICAL EXAM: Vitals with BMI 06/28/2020 01/24/2020 01/24/2020  Height 5\' 9"  - -  Weight - - -  BMI - - -  Systolic 427 062 376  Diastolic 77 73 55  Pulse 72 54 59   CONSTITUTIONAL: Well-developed and well-nourished. No acute distress.  SKIN: Skin is warm and dry. No rash noted. No cyanosis. No pallor. No jaundice HEAD: Normocephalic and atraumatic.  EYES: No scleral icterus MOUTH/THROAT: Moist oral membranes.  NECK: No JVD present. No thyromegaly noted. No carotid bruits  LYMPHATIC: No visible cervical adenopathy.  CHEST Normal respiratory effort. No intercostal retractions  LUNGS: Clear to auscultation bilaterally.  No stridor. No wheezes. No rales.  CARDIOVASCULAR: Irregularly irregular, variable E8-B1, soft holosystolic murmur heard at the apex, no gallops or rubs. ABDOMINAL: No apparent ascites.  EXTREMITIES: No peripheral edema  HEMATOLOGIC: No significant bruising NEUROLOGIC: Oriented to person, place, and time. Nonfocal. Normal muscle tone.  PSYCHIATRIC: Normal mood and affect. Normal behavior. Cooperative  CARDIAC DATABASE: EKG: 06/28/2020: Atrial fibrillation, 97 bpm, RBBB, without underlying injury pattern.   Echocardiogram: No results found for this or any previous visit from the past 1095 days.   Stress Testing: No results found for this or any previous visit from the past 1095 days.  Heart Catheterization: None  LABORATORY DATA: CBC Latest Ref Rng & Units 01/24/2020 01/23/2020  WBC 4.0 - 10.5 K/uL 6.8 11.1(H)  Hemoglobin 13.0 - 17.0 g/dL 12.2(L) 14.4  Hematocrit 39 - 52 % 36.7(L) 43.7  Platelets  150 - 400 K/uL 169 223    CMP Latest Ref Rng & Units 01/24/2020 01/23/2020  Glucose 70 - 99 mg/dL 123(H) 132(H)  BUN 8 - 23 mg/dL 15 18  Creatinine 0.61 - 1.24 mg/dL 1.37(H) 1.48(H)  Sodium 135 - 145 mmol/L 138 138  Potassium 3.5 - 5.1 mmol/L 3.6 3.8  Chloride 98 - 111 mmol/L 104 103  CO2 22 - 32 mmol/L 23 23  Calcium 8.9 - 10.3 mg/dL 8.3(L) 9.2  Total Protein 6.5 - 8.1 g/dL - 7.7  Total Bilirubin 0.3 - 1.2 mg/dL - 1.3(H)  Alkaline Phos 38 - 126 U/L - 66  AST 15 - 41 U/L - 22  ALT 0 - 44 U/L - 25    Lipid Panel  No results found for: CHOL, TRIG, HDL, CHOLHDL, VLDL, LDLCALC, LDLDIRECT, LABVLDL  No components found for: NTPROBNP No results for input(s): PROBNP in the last 8760 hours. No results for input(s): TSH in the last 8760 hours.  BMP Recent Labs    01/23/20 1244 01/24/20 0459  NA 138 138  K 3.8 3.6  CL 103 104  CO2 23 23  GLUCOSE 132* 123*  BUN 18 15  CREATININE 1.48* 1.37*  CALCIUM 9.2 8.3*  GFRNONAA 46* 50*  GFRAA 53* 58*    HEMOGLOBIN A1C Lab Results  Component Value Date   HGBA1C  6.0 (H) 01/24/2020   MPG 126 01/24/2020    IMPRESSION:    ICD-10-CM   1. Atrial fibrillation, unspecified type (HCC)  I48.91 EKG 12-Lead    metoprolol succinate (TOPROL XL) 25 MG 24 hr tablet    PCV ECHOCARDIOGRAM COMPLETE  2. Long term (current) use of anticoagulants  Z79.01   3. Benign hypertension with CKD (chronic kidney disease) stage III  I12.9 losartan (COZAAR) 100 MG tablet   N18.30 hydrochlorothiazide (HYDRODIURIL) 12.5 MG tablet  4. Hypercholesteremia  E78.00   5. Prediabetes  R73.03   d   RECOMMENDATIONS: DELANDO SATTER is a 74 y.o. male whose past medical history and cardiac risk factors include: Atrial fibrillation, hypertension, hypercholesterolemia, chronic kidney disease stage III, prediabetes, advanced age.  Atrial fibrillation: Chronicity unknown. Patient does not appreciate when he goes in or out of atrial fibrillation. We will start Toprol-XL 25  mg p.o. every morning. Rhythm control: N/A. Thromboembolic prophylaxis: Currently on Eliquis 5 mg p.o. twice daily. Plan echocardiogram to evaluate for structural heart disease. Patient would benefit from TEE cardioversion but would like to optimize his anticoagulation for approximately 4 to 6 weeks prior to the procedure.  We will continue with current medical therapy and I will see him back in 4 weeks to rediscuss the risks, benefits, and alternatives to TEE cardioversion.  Patient will benefit also from stress test.  We will hold off on ordering it for now as he is not having any active chest pain or anginal equivalent symptoms.  We will wait until his sinus rhythm is possibly restored.  Long-term oral anticoagulation: Indication: Atrial fibrillation. Patient already on oral anticoagulation prior to establishing care. Reemphasized the risks, benefits, and alternatives to oral anticoagulation.  Patient verbalizes understanding that if he injures himself despite mechanism of action he will seek medical attention at the closest ER via EMS to be evaluated as bleeding can lead to worsening morbidity mortality and even death.  Patient verbalizes understanding and provides verbal feedback.  Benign essential hypertension: Patient states that when he stands up quickly at times he gets lightheaded and dizziness which usually resolves after 1 minute. I think he will benefit from discontinuing his combination pill of losartan/hydrochlorthiazide and changing it to 2 separate pills 1 which he takes in the morning and the other in the evening. The initial thought process was attempted discuss it further with Dr. Luciana Axe but he states that his next appointment with Dr. Luciana Axe is 1 year out.  And says we decided to start Toprol-XL his antihypertensive medications were also minimally changed.  Prediabetes: Currently managed by primary team   FINAL MEDICATION LIST END OF ENCOUNTER: Meds ordered this encounter    Medications  . losartan (COZAAR) 100 MG tablet    Sig: Take 1 tablet (100 mg total) by mouth every evening.    Dispense:  30 tablet    Refill:  0  . hydrochlorothiazide (HYDRODIURIL) 12.5 MG tablet    Sig: Take 2 tablets (25 mg total) by mouth in the morning.    Dispense:  60 tablet    Refill:  0  . metoprolol succinate (TOPROL XL) 25 MG 24 hr tablet    Sig: Take 1 tablet (25 mg total) by mouth in the morning.    Dispense:  30 tablet    Refill:  0    Current Outpatient Medications:  .  ELIQUIS 5 MG TABS tablet, Take 5 mg by mouth 2 (two) times daily., Disp: , Rfl:  .  pravastatin (  PRAVACHOL) 20 MG tablet, Take 20 mg by mouth daily., Disp: , Rfl:  .  psyllium (METAMUCIL SMOOTH TEXTURE) 28 % packet, Take 1 packet by mouth 2 (two) times daily., Disp: , Rfl:  .  hydrochlorothiazide (HYDRODIURIL) 12.5 MG tablet, Take 2 tablets (25 mg total) by mouth in the morning., Disp: 60 tablet, Rfl: 0 .  losartan (COZAAR) 100 MG tablet, Take 1 tablet (100 mg total) by mouth every evening., Disp: 30 tablet, Rfl: 0 .  metoprolol succinate (TOPROL XL) 25 MG 24 hr tablet, Take 1 tablet (25 mg total) by mouth in the morning., Disp: 30 tablet, Rfl: 0 .  Polyethyl Glycol-Propyl Glycol (SYSTANE OP), Place 1 drop into both eyes daily as needed (For dry eyes)., Disp: , Rfl:   Orders Placed This Encounter  Procedures  . EKG 12-Lead  . PCV ECHOCARDIOGRAM COMPLETE    There are no Patient Instructions on file for this visit.   --Continue cardiac medications as reconciled in final medication list. --Return in about 4 weeks (around 07/26/2020) for A. fib, Review test results. Or sooner if needed. --Continue follow-up with your primary care physician regarding the management of your other chronic comorbid conditions.  Patient's questions and concerns were addressed to his satisfaction. He voices understanding of the instructions provided during this encounter.   This note was created using a voice recognition  software as a result there may be grammatical errors inadvertently enclosed that do not reflect the nature of this encounter. Every attempt is made to correct such errors.  Rex Kras, [place], [facility]  Pager: [phone] Office: [phone]

## 2020-06-29 ENCOUNTER — Other Ambulatory Visit: Payer: Self-pay

## 2020-06-29 MED ORDER — HYDROCHLOROTHIAZIDE 12.5 MG PO TABS: 12.5000 mg | ORAL_TABLET | Freq: Every day | ORAL | 0 refills | Status: DC

## 2020-07-10 ENCOUNTER — Other Ambulatory Visit: Payer: Self-pay

## 2020-07-10 ENCOUNTER — Ambulatory Visit: Payer: Medicare Other

## 2020-07-10 DIAGNOSIS — I4891 Unspecified atrial fibrillation: Secondary | ICD-10-CM

## 2020-07-18 ENCOUNTER — Telehealth: Payer: Self-pay

## 2020-07-18 NOTE — Telephone Encounter (Signed)
Unable to reach patient regarding echo results. Left vm to cb.

## 2020-07-18 NOTE — Telephone Encounter (Signed)
-----   Message from [place], [place] sent at 07/17/2020 10:57 PM EDT ----- Please inform the patient to follow-up at his next office visit to review test results.  He is noted to have some regional wall motion abnormalities echo.

## 2020-07-19 NOTE — Telephone Encounter (Signed)
Relayed information regarding echo and upcoming appt to pt. Patient will attend upcoming appt.

## 2020-07-26 ENCOUNTER — Other Ambulatory Visit: Payer: Self-pay

## 2020-07-26 ENCOUNTER — Ambulatory Visit: Payer: Medicare Other | Admitting: Cardiology

## 2020-07-26 ENCOUNTER — Encounter: Payer: Self-pay | Admitting: Cardiology

## 2020-07-26 VITALS — BP 140/74 | HR 74 | Ht 69.0 in | Wt 201.0 lb

## 2020-07-26 DIAGNOSIS — I4891 Unspecified atrial fibrillation: Secondary | ICD-10-CM

## 2020-07-26 DIAGNOSIS — Z7901 Long term (current) use of anticoagulants: Secondary | ICD-10-CM

## 2020-07-26 DIAGNOSIS — I129 Hypertensive chronic kidney disease with stage 1 through stage 4 chronic kidney disease, or unspecified chronic kidney disease: Secondary | ICD-10-CM

## 2020-07-26 DIAGNOSIS — R7303 Prediabetes: Secondary | ICD-10-CM

## 2020-07-26 DIAGNOSIS — E7800 Pure hypercholesterolemia, unspecified: Secondary | ICD-10-CM

## 2020-07-26 NOTE — Progress Notes (Signed)
ID:  Darryl Powers, DOB 01-03-1946, MRN [id number]  PCP:  Bartholome Bill, MD  Cardiologist:  Rex Kras, DO, [facility] (established care 06/28/2020)  Date: 07/26/20 Last Office Visit: 06/28/2020   Chief Complaint  Patient presents with  . Atrial Fibrillation  . Follow-up    HPI  Darryl Powers is a 74 y.o. male who presents to the office with a chief complaint of " atrial fibrillation management." Patient's past medical history and cardiovascular risk factors include: Hypertension, hypercholesterolemia, chronic kidney disease stage III, prediabetes, advanced age.  He is referred to the office at the request of Bartholome Bill, MD for evaluation of atrial fibrillation.  Patient is accompanied by his wife Darryl Powers at today's visit.  Patient is here for follow-up for A. fib management.  He was last seen in the office proximally 1 month ago as he was referred by his primary care provider for evaluation of atrial fibrillation.  At that time he was started on Toprol-XL and we discussed possibly undergoing TEE cardioversion after being anticoagulated for 4 weeks.  Since last office visit he is doing well from a cardiovascular standpoint.  He remains asymptomatic despite his underlying atrial fibrillation.  He continues to go to the gym 3 times a week without any effort related chest pain or shortness of breath.  Patient is accompanied by his wife at today's visit and there debating if they should continue medical management as opposed to proceed with TEE cardioversion.  Their questions and concerns were addressed to their satisfaction.  They are requesting some time to reconsider this.  Echocardiogram results reviewed with the patient.  Recommended to proceed with stress test if they choose not to undergo cardioversion.  With the plans of considering the initiation of antiarrhythmic medications after the stress test.  However, patient is reluctant to be on additional medications for  prolonged period of time and may still consider undergoing cardioversion.  Denies prior history of coronary artery disease, myocardial infarction, congestive heart failure, deep venous thrombosis, pulmonary embolism, stroke, transient ischemic attack.  FUNCTIONAL STATUS: Three days a week goes to the gym and works on Editor, commissioning, elliptical, and treadmill.    ALLERGIES: Allergies  Allergen Reactions  . Ace Inhibitors Cough  . Amoxicillin-Pot Clavulanate Rash    MEDICATION LIST PRIOR TO VISIT: Current Meds  Medication Sig  . ELIQUIS 5 MG TABS tablet Take 5 mg by mouth 2 (two) times daily.  . hydrochlorothiazide (HYDRODIURIL) 12.5 MG tablet Take 1 tablet (12.5 mg total) by mouth daily.  Marland Kitchen losartan (COZAAR) 100 MG tablet TAKE 1 TABLET(100 MG) BY MOUTH EVERY EVENING  . metoprolol succinate (TOPROL-XL) 25 MG 24 hr tablet TAKE 1 TABLET(25 MG) BY MOUTH IN THE MORNING  . Polyethyl Glycol-Propyl Glycol (SYSTANE OP) Place 1 drop into both eyes daily as needed (For dry eyes).  . pravastatin (PRAVACHOL) 20 MG tablet Take 20 mg by mouth daily.  . psyllium (METAMUCIL SMOOTH TEXTURE) 28 % packet Take 1 packet by mouth 2 (two) times daily.     PAST MEDICAL HISTORY: Past Medical History:  Diagnosis Date  . Atrial fibrillation ([place])   . Diverticulitis   . Hyperlipidemia   . Hypertension   . Melanoma ([place])     PAST SURGICAL HISTORY: Past Surgical History:  Procedure Laterality Date  . REDUCTION OF TORSION OF TESTIS    . TENDON REPAIR Left 04/13/2016   Procedure: REPAIR TENDON NERVE INDEX AND LONG FINGER;  Surgeon: Dayna Barker, MD;  Location:  [facility];  Service: Plastics;  Laterality: Left;    FAMILY HISTORY: The patient family history includes Aneurysm in his father; Leukemia in his brother; Stroke in his mother.  SOCIAL HISTORY:  The patient  reports that he has never smoked. He has never used smokeless tobacco. He reports that he does not drink alcohol and does not use  drugs.  REVIEW OF SYSTEMS: Review of Systems  Constitutional: Negative for chills and fever.  HENT: Negative for hoarse voice and nosebleeds.   Eyes: Negative for discharge, double vision and pain.  Cardiovascular: Negative for chest pain, claudication, dyspnea on exertion, leg swelling, near-syncope, orthopnea, palpitations, paroxysmal nocturnal dyspnea and syncope.  Respiratory: Negative for hemoptysis and shortness of breath.   Musculoskeletal: Negative for muscle cramps and myalgias.  Gastrointestinal: Negative for abdominal pain, constipation, diarrhea, hematemesis, hematochezia, melena, nausea and vomiting.  Neurological: Negative for dizziness and light-headedness.   PHYSICAL EXAM: Vitals with BMI 07/26/2020 06/28/2020 01/24/2020  Height 5\' 9"  5\' 9"  -  Weight 201 lbs - -  BMI 83.41 - -  Systolic 962 229 798  Diastolic 74 77 73  Pulse 74 72 54   CONSTITUTIONAL: Well-developed and well-nourished. No acute distress.  SKIN: Skin is warm and dry. No rash noted. No cyanosis. No pallor. No jaundice HEAD: Normocephalic and atraumatic.  EYES: No scleral icterus MOUTH/THROAT: Moist oral membranes.  NECK: No JVD present. No thyromegaly noted. No carotid bruits  LYMPHATIC: No visible cervical adenopathy.  CHEST Normal respiratory effort. No intercostal retractions  LUNGS: Clear to auscultation bilaterally.  No stridor. No wheezes. No rales.  CARDIOVASCULAR: Irregularly irregular, variable X2-J1, soft holosystolic murmur heard at the apex, no gallops or rubs. ABDOMINAL: No apparent ascites.  EXTREMITIES: No peripheral edema  HEMATOLOGIC: No significant bruising NEUROLOGIC: Oriented to person, place, and time. Nonfocal. Normal muscle tone.  PSYCHIATRIC: Normal mood and affect. Normal behavior. Cooperative  CARDIAC DATABASE: EKG: 06/28/2020: Atrial fibrillation, 97 bpm, RBBB, without underlying injury pattern.  07/26/2020: Atrial fibrillation, 85 bpm, normal axis, incomplete right  bundle branch block, without underlying injury pattern.  Echocardiogram: No results found for this or any previous visit from the past 1095 days.   Stress Testing: No results found for this or any previous visit from the past 1095 days.  Heart Catheterization: None  LABORATORY DATA: CBC Latest Ref Rng & Units 01/24/2020 01/23/2020  WBC 4.0 - 10.5 K/uL 6.8 11.1(H)  Hemoglobin 13.0 - 17.0 g/dL 12.2(L) 14.4  Hematocrit 39 - 52 % 36.7(L) 43.7  Platelets 150 - 400 K/uL 169 223    CMP Latest Ref Rng & Units 01/24/2020 01/23/2020  Glucose 70 - 99 mg/dL 123(H) 132(H)  BUN 8 - 23 mg/dL 15 18  Creatinine 0.61 - 1.24 mg/dL 1.37(H) 1.48(H)  Sodium 135 - 145 mmol/L 138 138  Potassium 3.5 - 5.1 mmol/L 3.6 3.8  Chloride 98 - 111 mmol/L 104 103  CO2 22 - 32 mmol/L 23 23  Calcium 8.9 - 10.3 mg/dL 8.3(L) 9.2  Total Protein 6.5 - 8.1 g/dL - 7.7  Total Bilirubin 0.3 - 1.2 mg/dL - 1.3(H)  Alkaline Phos 38 - 126 U/L - 66  AST 15 - 41 U/L - 22  ALT 0 - 44 U/L - 25    Lipid Panel  No results found for: CHOL, TRIG, HDL, CHOLHDL, VLDL, LDLCALC, LDLDIRECT, LABVLDL  No components found for: NTPROBNP No results for input(s): PROBNP in the last 8760 hours. No results for input(s): TSH in the last 8760 hours.  BMP Recent Labs    01/23/20 1244 01/24/20 0459  NA 138 138  K 3.8 3.6  CL 103 104  CO2 23 23  GLUCOSE 132* 123*  BUN 18 15  CREATININE 1.48* 1.37*  CALCIUM 9.2 8.3*  GFRNONAA 46* 50*  GFRAA 53* 58*    HEMOGLOBIN A1C Lab Results  Component Value Date   HGBA1C 6.0 (H) 01/24/2020   MPG 126 01/24/2020    IMPRESSION:    ICD-10-CM   1. Atrial fibrillation, unspecified type (HCC)  I48.91 EKG 12-Lead    Hemoglobin and hematocrit, blood    Basic metabolic panel    Magnesium  2. Long term (current) use of anticoagulants  Z79.01 Hemoglobin and hematocrit, blood    Basic metabolic panel    Magnesium  3. Benign hypertension with CKD (chronic kidney disease) stage III (HCC)  I12.9     N18.30   4. Hypercholesteremia  E78.00   5. Prediabetes  R73.03     RECOMMENDATIONS: HOANG PETTINGILL is a 74 y.o. male whose past medical history and cardiac risk factors include: Atrial fibrillation, hypertension, hypercholesterolemia, chronic kidney disease stage III, prediabetes, advanced age.  Atrial fibrillation:  Chronicity unknown.  Patient does not appreciate when he goes in or out of atrial fibrillation.  Continue Toprol-XL 25 mg p.o. every morning.  Rhythm control: N/A.  Thromboembolic prophylaxis: Currently on Eliquis 5 mg p.o. twice daily.  Patient and wife requesting some time did we discussed undergoing transesophageal echocardiogram with direct-current cardioversion.  Patient will call the office if he decides to proceed with TEE cardioversion.  Check hemoglobin hematocrit.  Check BMP  After careful review of history and examination, the risks, benefits of transesophageal echocardiogram, and alternatives have been explained to the patient and his wife. Complications include but not limited to esophageal perforation (rare), gastrointestinal bleeding (rare), cardiac arrhythmia which can include cardiac arrest and death (rare), pharyngeal irritation / discomfort with swallowing / hematoma, methemoglobinemia, bronchospasm, transient hypoxia, nonsustained ventricular tachycardia, transient atrial fibrillation, minimal hemoptysis, vomiting, hypotension, respiratory compromise, reaction to medications, unavoidable damage to teeth and gums, aspiration pneumonia  were reviewed with the patient.  Patient voices understands, provides verbal feedback, questions answered, and patient and wife will call the office if they wish to proceed with the transesophageal echocardiogram.  Risks, benefits, and alternatives of direct current cardioversion reviewed with the and patient and his wife. Risk includes but not limited to: potential for post-cardioversion rhythms, life-threatening  arrhythmias (ventricular tachycardia and fibrillation, profound bradycardia, cardiac arrest), myocardial damage, acute pulmonary edema, skin burns, transient hypotension. Benefits include restoration of sinus rhythm. Alternatives to treatment were discussed, questions were answered, patient voices understanding and provides verbal feedback.  Patient or wife will call the office if they wish to proceed with the direct-current cardioversion.  Long-term oral anticoagulation:  Indication: Atrial fibrillation.  CHA2DS2-VASc SCORE is 3 which correlates to 3.2 % risk of stroke per year.  Patient already on oral anticoagulation prior to establishing care.  Reemphasized the risks, benefits, and alternatives to oral anticoagulation.    Benign essential hypertension:  Patient states that he feels better after discontinuing the combination pill of losartan/hydrochlorothiazide into 2 separate pills 1 which he takes in the morning and the other one he takes at night.  By doing this he has less symptoms of dizziness.  Currently managed by primary care provider.  Prediabetes: Currently managed by primary team   FINAL MEDICATION LIST END OF ENCOUNTER: No orders of the defined types were placed in  this encounter.   Current Outpatient Medications:  .  ELIQUIS 5 MG TABS tablet, Take 5 mg by mouth 2 (two) times daily., Disp: , Rfl:  .  hydrochlorothiazide (HYDRODIURIL) 12.5 MG tablet, Take 1 tablet (12.5 mg total) by mouth daily., Disp: 90 tablet, Rfl: 0 .  losartan (COZAAR) 100 MG tablet, TAKE 1 TABLET(100 MG) BY MOUTH EVERY EVENING, Disp: 90 tablet, Rfl: 0 .  metoprolol succinate (TOPROL-XL) 25 MG 24 hr tablet, TAKE 1 TABLET(25 MG) BY MOUTH IN THE MORNING, Disp: 90 tablet, Rfl: 0 .  Polyethyl Glycol-Propyl Glycol (SYSTANE OP), Place 1 drop into both eyes daily as needed (For dry eyes)., Disp: , Rfl:  .  pravastatin (PRAVACHOL) 20 MG tablet, Take 20 mg by mouth daily., Disp: , Rfl:  .  psyllium  (METAMUCIL SMOOTH TEXTURE) 28 % packet, Take 1 packet by mouth 2 (two) times daily., Disp: , Rfl:   Orders Placed This Encounter  Procedures  . Hemoglobin and hematocrit, blood  . Basic metabolic panel  . Magnesium  . EKG 12-Lead   There are no Patient Instructions on file for this visit.   --Continue cardiac medications as reconciled in final medication list. --Return in about 4 weeks (around 08/23/2020) for Reevaluation of Afib management. . Or sooner if needed. --Continue follow-up with your primary care physician regarding the management of your other chronic comorbid conditions.  Patient's questions and concerns were addressed to his satisfaction. He voices understanding of the instructions provided during this encounter.   This note was created using a voice recognition software as a result there may be grammatical errors inadvertently enclosed that do not reflect the nature of this encounter. Every attempt is made to correct such errors.  Rex Kras, [place], [facility]  Pager: [phone] Office: [phone]

## 2020-07-27 ENCOUNTER — Other Ambulatory Visit: Payer: Self-pay | Admitting: Cardiology

## 2020-08-16 ENCOUNTER — Telehealth: Payer: Self-pay

## 2020-08-16 LAB — BASIC METABOLIC PANEL
BUN/Creatinine Ratio: 11 (ref 10–24)
BUN: 18 mg/dL (ref 8–27)
CO2: 29 mmol/L (ref 20–29)
Calcium: 9.9 mg/dL (ref 8.6–10.2)
Chloride: 98 mmol/L (ref 96–106)
Creatinine, Ser: 1.62 mg/dL — ABNORMAL HIGH (ref 0.76–1.27)
GFR calc Af Amer: 47 mL/min/{1.73_m2} — ABNORMAL LOW (ref >59)
GFR calc non Af Amer: 41 mL/min/{1.73_m2} — ABNORMAL LOW (ref >59)
Glucose: 134 mg/dL — ABNORMAL HIGH (ref 65–99)
Potassium: 4.7 mmol/L (ref 3.5–5.2)
Sodium: 142 mmol/L (ref 134–144)

## 2020-08-16 LAB — HEMOGLOBIN AND HEMATOCRIT, BLOOD
Hematocrit: 43.8 % (ref 37.5–51.0)
Hemoglobin: 14.5 g/dL (ref 13.0–17.7)

## 2020-08-16 LAB — MAGNESIUM: Magnesium: 2 mg/dL (ref 1.6–2.3)

## 2020-08-16 NOTE — Telephone Encounter (Signed)
FYI: pt called just to let you know that he went to [facility] yesterday and had his labs done

## 2020-08-18 ENCOUNTER — Other Ambulatory Visit: Payer: Self-pay | Admitting: Cardiology

## 2020-08-18 DIAGNOSIS — I129 Hypertensive chronic kidney disease with stage 1 through stage 4 chronic kidney disease, or unspecified chronic kidney disease: Secondary | ICD-10-CM

## 2020-08-18 NOTE — Telephone Encounter (Signed)
Okay. Results reviewed and MA reached out to her.

## 2020-08-18 NOTE — Progress Notes (Signed)
Called and spoke with patient regarding his blood work. He understands to cut losartan 100mg  in half, to start taking Losartan 50mg . Also patient is aware to have repeat  blood work in 1 week.

## 2020-08-26 LAB — BASIC METABOLIC PANEL
BUN/Creatinine Ratio: 13 (ref 10–24)
BUN: 19 mg/dL (ref 8–27)
CO2: 28 mmol/L (ref 20–29)
Calcium: 9.1 mg/dL (ref 8.6–10.2)
Chloride: 99 mmol/L (ref 96–106)
Creatinine, Ser: 1.43 mg/dL — ABNORMAL HIGH (ref 0.76–1.27)
GFR calc Af Amer: 55 mL/min/{1.73_m2} — ABNORMAL LOW (ref >59)
GFR calc non Af Amer: 47 mL/min/{1.73_m2} — ABNORMAL LOW (ref >59)
Glucose: 117 mg/dL — ABNORMAL HIGH (ref 65–99)
Potassium: 4.4 mmol/L (ref 3.5–5.2)
Sodium: 140 mmol/L (ref 134–144)

## 2020-08-31 NOTE — Progress Notes (Signed)
Relayed information to patient regarding results. Patient is requesting to give office a call back closer to Christmas to schedule a follow up appt as he is unaware of his schedule until then.

## 2020-09-25 ENCOUNTER — Other Ambulatory Visit: Payer: Self-pay | Admitting: Cardiology

## 2020-09-25 DIAGNOSIS — I4891 Unspecified atrial fibrillation: Secondary | ICD-10-CM

## 2020-10-25 ENCOUNTER — Other Ambulatory Visit: Payer: Self-pay | Admitting: Cardiology

## 2020-12-22 ENCOUNTER — Other Ambulatory Visit: Payer: Self-pay | Admitting: Cardiology

## 2020-12-22 DIAGNOSIS — I4891 Unspecified atrial fibrillation: Secondary | ICD-10-CM

## 2021-01-31 ENCOUNTER — Other Ambulatory Visit: Payer: Self-pay | Admitting: Cardiology

## 2021-03-18 ENCOUNTER — Other Ambulatory Visit: Payer: Self-pay | Admitting: Cardiology

## 2021-03-18 DIAGNOSIS — I4891 Unspecified atrial fibrillation: Secondary | ICD-10-CM

## 2021-03-20 ENCOUNTER — Other Ambulatory Visit: Payer: Self-pay | Admitting: Cardiology

## 2021-03-20 DIAGNOSIS — I4891 Unspecified atrial fibrillation: Secondary | ICD-10-CM

## 2021-05-10 ENCOUNTER — Other Ambulatory Visit: Payer: Self-pay | Admitting: Cardiology

## 2021-06-06 IMAGING — CT CT ABD-PELV W/ CM
2 of 5 series · 16 of 46 positions shown, 18 images · IV contrast (APPLIED)
Comparison: 05/19/2007

CLINICAL DATA: Abdominal pain

EXAM:
CT ABDOMEN AND PELVIS WITH CONTRAST
TECHNIQUE: Multidetector CT imaging of the abdomen and pelvis was performed
using the standard protocol following bolus administration of
intravenous contrast.
CONTRAST:  100mL OMNIPAQUE IOHEXOL 300 MG/ML  SOLN

[Series 3: abd/ pelvis 5.0 i30f 2 · axial · 0.81mm/px · z∈[-602,-176]mm · 13 of 97 slices shown, 15 images]
[im 6/97  soft-tissue]
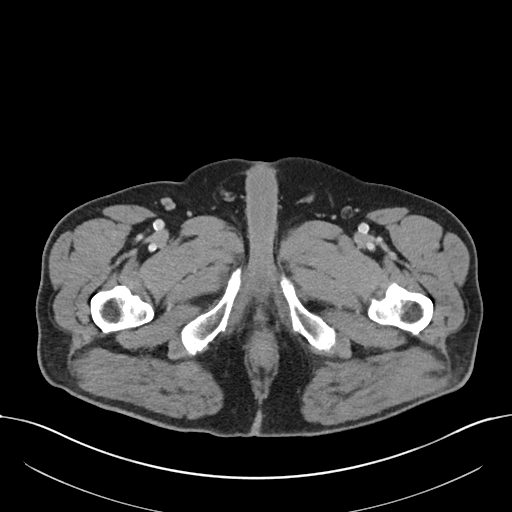
[im 6/97  bone]
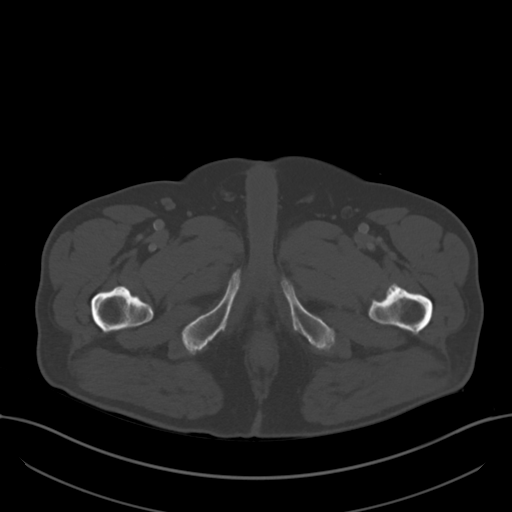
[im 16/97  soft-tissue]
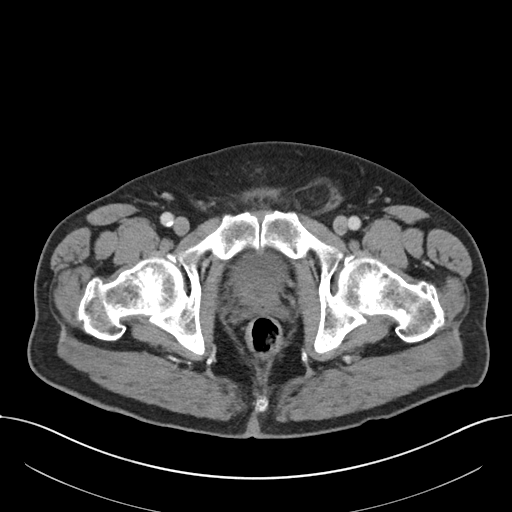
[im 21/97  soft-tissue]
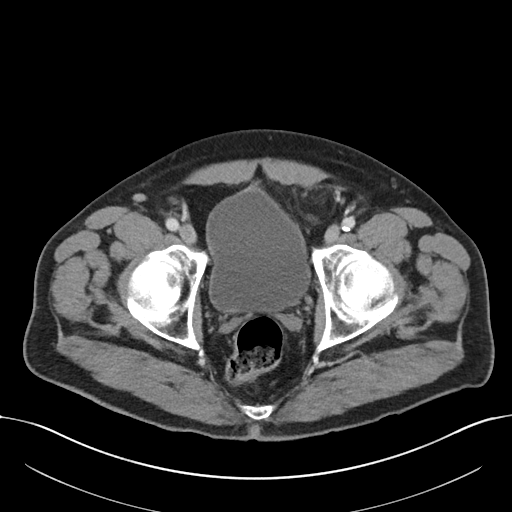
[im 26/97  soft-tissue]
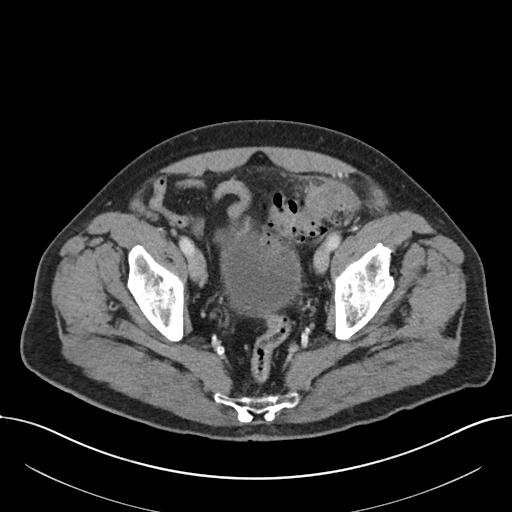
[im 36/97  soft-tissue]
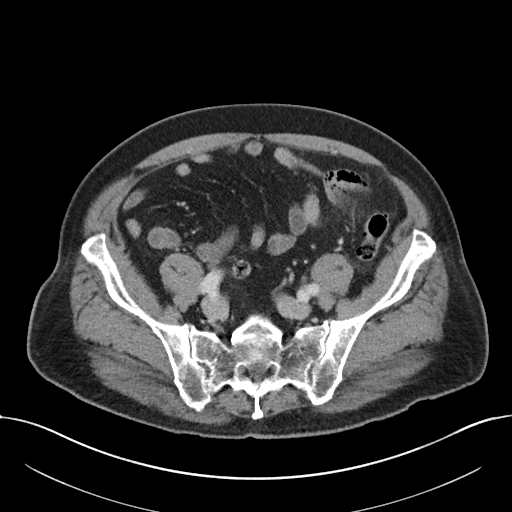
[im 41/97  soft-tissue]
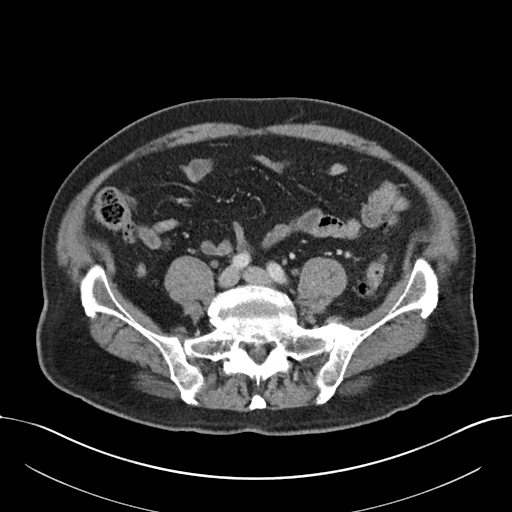
[im 51/97  soft-tissue]
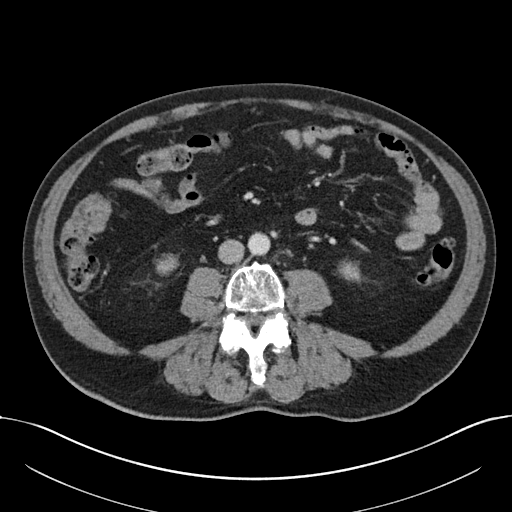
[im 56/97  soft-tissue]
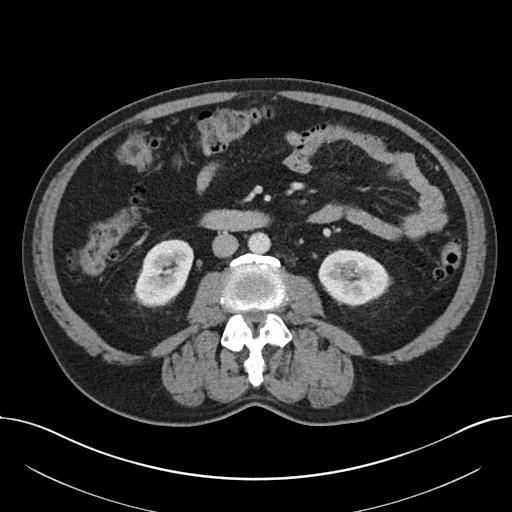
[im 61/97  soft-tissue]
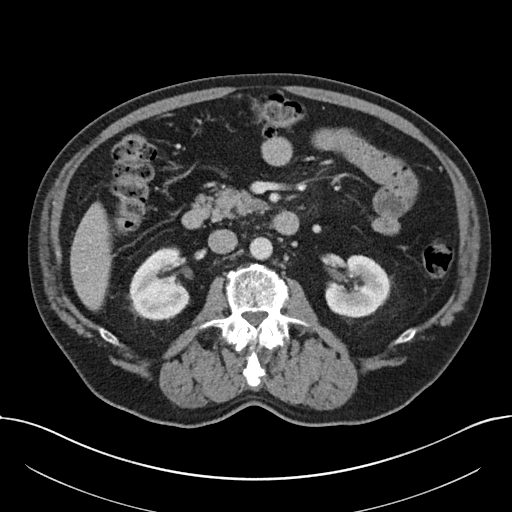
[im 61/97  bone]
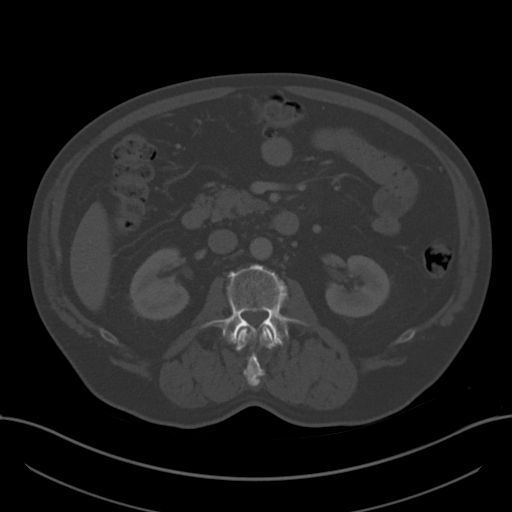
[im 71/97  soft-tissue]
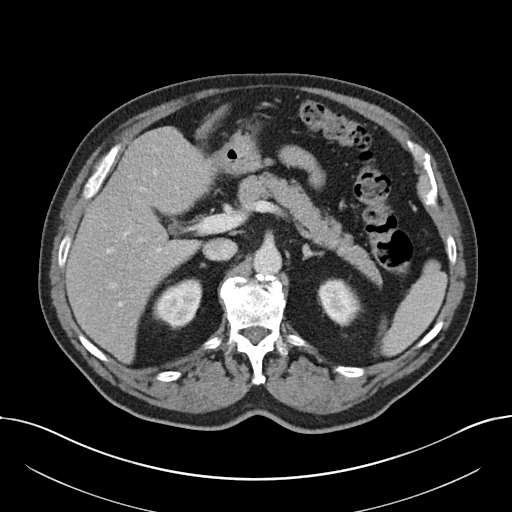
[im 76/97  soft-tissue]
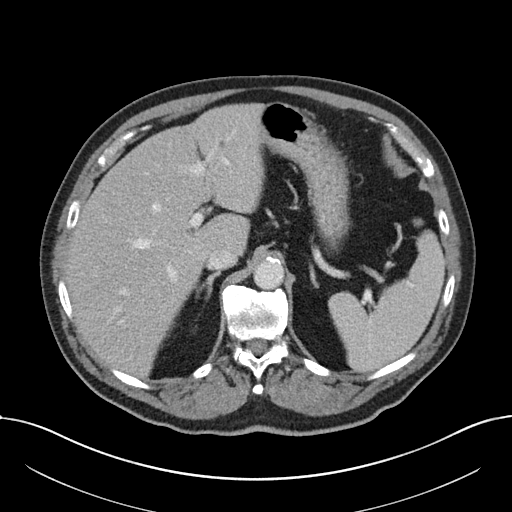
[im 81/97  soft-tissue]
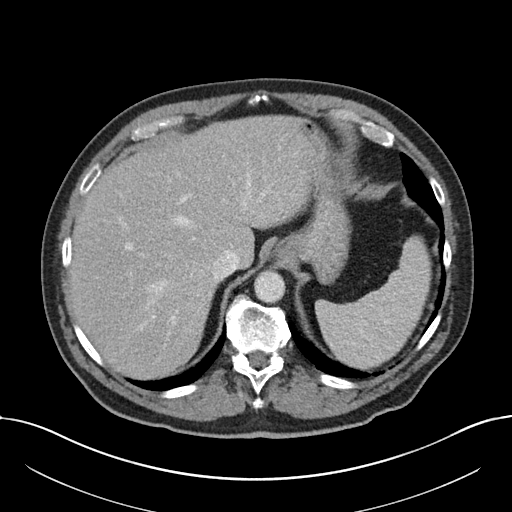
[im 91/97  soft-tissue]
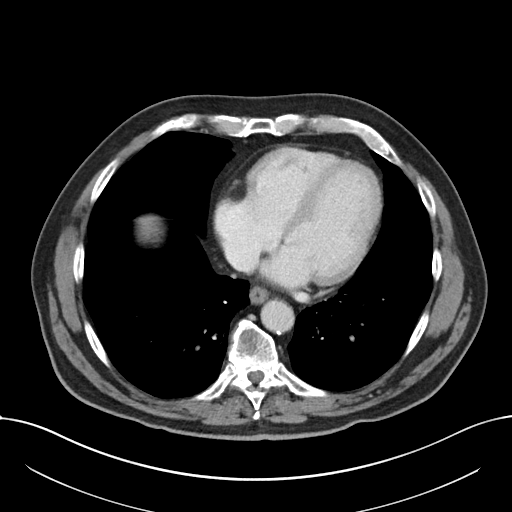

[Series 6: coronal soft tissue · coronal · 0.76mm/px · 3 of 101 slices shown]
[im 34/101  soft-tissue]
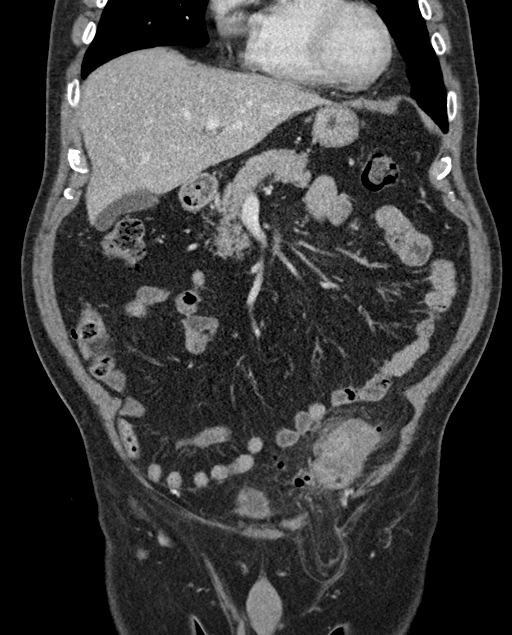
[im 45/101  soft-tissue]
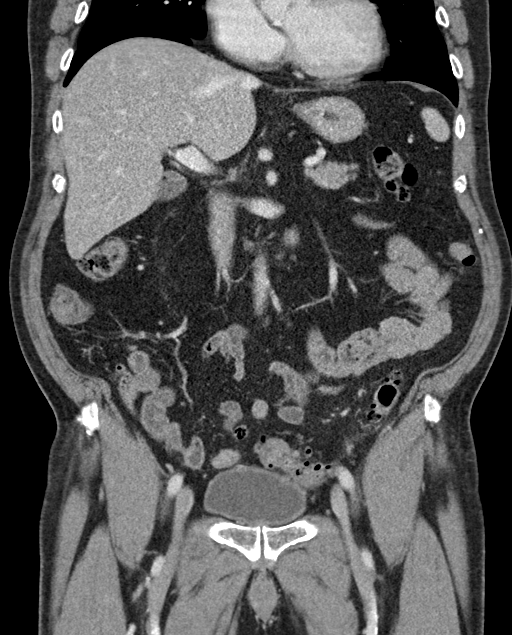
[im 56/101  soft-tissue]
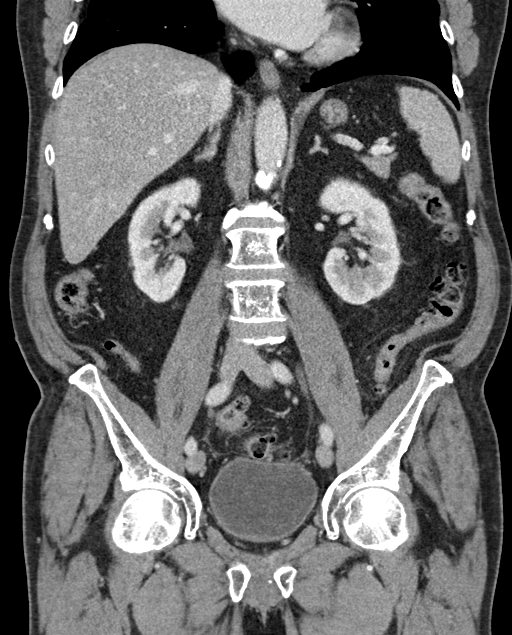

[16 of 46 positions shown; findings below may reference images not displayed]

FINDINGS: Lower chest: No acute abnormality.

Hepatobiliary: Mildly diffusely decreased attenuation of the hepatic
parenchyma suggesting hepatic steatosis. No focal hepatic lesion.
Gallbladder is unremarkable. No hyperdense gallstone. No biliary
dilatation.

Pancreas: Unremarkable. No pancreatic ductal dilatation or
surrounding inflammatory changes.

Spleen: Normal in size without focal abnormality.

Adrenals/Urinary Tract: Adrenal glands are unremarkable. Kidneys are
normal, without renal calculi, focal lesion, or hydronephrosis.
Bladder is unremarkable.

Stomach/Bowel: Short segment circumferential bowel wall thickening
of the proximal sigmoid colon with pericolonic fat stranding and
trace free fluid. Contained micro perforation suspected along the
anterior wall with surrounding fluid (series 3, image 68). Numerous
sigmoid diverticula. No free air or well-defined fluid collection.
No dilated loops of bowel to suggest obstruction. A normal appendix
is present within the right lower quadrant. Stomach and small bowel
within normal limits.

Vascular/Lymphatic: Minimal scattered atherosclerotic calcification.
No aneurysm. No abdominopelvic lymphadenopathy.

Reproductive: Prostate is unremarkable.

Other: Small fat containing left inguinal hernia. Trace fluid in the
left pericolic gutter. No abscess. No pneumoperitoneum.

Musculoskeletal: No acute or significant osseous findings.
IMPRESSION: 1. Acute sigmoid diverticulitis with suspected contained
microperforation. No free intraperitoneal air. No well-defined
abscess.
2. Hepatic steatosis.
3. Small fat containing left inguinal hernia.

These results were called by telephone at the time of interpretation
on 01/23/2020 at [DATE] to provider ANGI BILLIOT , who verbally
acknowledged these results.

## 2021-06-28 ENCOUNTER — Other Ambulatory Visit: Payer: Self-pay | Admitting: Cardiology

## 2021-06-28 DIAGNOSIS — I4891 Unspecified atrial fibrillation: Secondary | ICD-10-CM

## 2021-08-14 ENCOUNTER — Other Ambulatory Visit: Payer: Self-pay | Admitting: Cardiology

## 2021-09-24 ENCOUNTER — Other Ambulatory Visit: Payer: Self-pay | Admitting: Cardiology

## 2021-09-24 DIAGNOSIS — I4891 Unspecified atrial fibrillation: Secondary | ICD-10-CM

## 2021-09-25 ENCOUNTER — Other Ambulatory Visit: Payer: Self-pay | Admitting: Cardiology

## 2021-09-25 ENCOUNTER — Other Ambulatory Visit: Payer: Self-pay

## 2021-09-25 DIAGNOSIS — I4891 Unspecified atrial fibrillation: Secondary | ICD-10-CM

## 2021-09-25 MED ORDER — METOPROLOL SUCCINATE ER 25 MG PO TB24: 25.0000 mg | ORAL_TABLET | Freq: Every day | ORAL | 0 refills | Status: DC

## 2021-10-29 ENCOUNTER — Other Ambulatory Visit: Payer: Self-pay | Admitting: Cardiology

## 2021-10-29 DIAGNOSIS — I4891 Unspecified atrial fibrillation: Secondary | ICD-10-CM

## 2021-10-30 ENCOUNTER — Other Ambulatory Visit: Payer: Self-pay

## 2021-10-30 ENCOUNTER — Encounter: Payer: Self-pay | Admitting: Cardiology

## 2021-10-30 ENCOUNTER — Ambulatory Visit: Payer: Medicare Other | Admitting: Cardiology

## 2021-10-30 VITALS — BP 129/72 | HR 85 | Temp 97.3°F | Resp 16 | Ht 69.0 in | Wt 199.2 lb

## 2021-10-30 DIAGNOSIS — Z7901 Long term (current) use of anticoagulants: Secondary | ICD-10-CM

## 2021-10-30 DIAGNOSIS — N183 Chronic kidney disease, stage 3 unspecified: Secondary | ICD-10-CM

## 2021-10-30 DIAGNOSIS — I129 Hypertensive chronic kidney disease with stage 1 through stage 4 chronic kidney disease, or unspecified chronic kidney disease: Secondary | ICD-10-CM

## 2021-10-30 DIAGNOSIS — R931 Abnormal findings on diagnostic imaging of heart and coronary circulation: Secondary | ICD-10-CM

## 2021-10-30 DIAGNOSIS — I4811 Longstanding persistent atrial fibrillation: Secondary | ICD-10-CM

## 2021-10-30 DIAGNOSIS — E781 Pure hyperglyceridemia: Secondary | ICD-10-CM

## 2021-10-30 DIAGNOSIS — E7800 Pure hypercholesterolemia, unspecified: Secondary | ICD-10-CM

## 2021-10-30 NOTE — Progress Notes (Signed)
ID:  Darryl Powers, DOB Nov 27, 1945, MRN [id number]  PCP:  Bartholome Bill, MD  Cardiologist:  Rex Kras, DO, [facility] (established care 06/28/2020)  Date: 10/30/21 Last Office Visit: 07/26/2020   Chief Complaint  Patient presents with   Atrial Fibrillation   Follow-up    HPI  Darryl Powers is a 76 y.o. male who presents to the office with a chief complaint of "1 year follow-up atrial fibrillation management." Patient's past medical history and cardiovascular risk factors include: Hypertension, hypercholesterolemia, chronic kidney disease stage III, prediabetes, advanced age.  Referred to the practice back in September 2021 for management of atrial fibrillation.  He is currently on rate control strategy and oral anticoagulation for thromboembolic prophylaxis.  He is tolerating the medications well without any side effects or intolerances.  Patient denies any prior history of intracranial bleeding or gastrointestinal bleeding.  In the past we have discussed either direct-current cardioversion after 4 weeks of oral anticoagulation or TEE guided cardioversion.  However he has been reluctant in the past and appears to have similar thoughts as clinically he is doing well.  His overall functional status remains relatively stable.  He continues to go to the gym 3 times a week.  In the past he had an echocardiogram which noted preserved LVEF with concerns for possible mild inferior/inferolateral hypokinesis.  We discussed undergoing stress testing but he had deferred at that time.  Clinically denies any anginal discomfort or heart failure symptoms.  No hospitalizations or urgent care visits since last office encounter.   FUNCTIONAL STATUS: Three days a week goes to the gym and works on Editor, commissioning, elliptical, and treadmill.    ALLERGIES: Allergies  Allergen Reactions   Ace Inhibitors Cough   Amoxicillin-Pot Clavulanate Rash    MEDICATION LIST PRIOR TO VISIT: Current Meds   Medication Sig   ELIQUIS 5 MG TABS tablet Take 5 mg by mouth 2 (two) times daily.   hydrochlorothiazide (HYDRODIURIL) 12.5 MG tablet TAKE 2 TABLETS(25 MG) BY MOUTH IN THE MORNING   losartan (COZAAR) 100 MG tablet TAKE 1 TABLET(100 MG) BY MOUTH EVERY EVENING   metoprolol succinate (TOPROL-XL) 25 MG 24 hr tablet TAKE 1 TABLET(25 MG) BY MOUTH DAILY   pravastatin (PRAVACHOL) 20 MG tablet Take 20 mg by mouth daily.   psyllium (METAMUCIL SMOOTH TEXTURE) 28 % packet Take 1 packet by mouth 2 (two) times daily.     PAST MEDICAL HISTORY: Past Medical History:  Diagnosis Date   Atrial fibrillation ([place])    Diverticulitis    Hyperlipidemia    Hypertension    Melanoma ([place])     PAST SURGICAL HISTORY: Past Surgical History:  Procedure Laterality Date   REDUCTION OF TORSION OF TESTIS     TENDON REPAIR Left 04/13/2016   Procedure: REPAIR TENDON NERVE INDEX AND LONG FINGER;  Surgeon: Dayna Barker, MD;  Location: [place];  Service: Plastics;  Laterality: Left;    FAMILY HISTORY: The patient family history includes Aneurysm in his father; Leukemia in his brother; Stroke in his mother.  SOCIAL HISTORY:  The patient  reports that he has never smoked. He has never used smokeless tobacco. He reports that he does not drink alcohol and does not use drugs.  REVIEW OF SYSTEMS: Review of Systems  Constitutional: Negative for chills and fever.  HENT:  Negative for hoarse voice and nosebleeds.   Eyes:  Negative for discharge, double vision and pain.  Cardiovascular:  Negative for chest pain, claudication, dyspnea on exertion, leg  swelling, near-syncope, orthopnea, palpitations, paroxysmal nocturnal dyspnea and syncope.  Respiratory:  Negative for hemoptysis and shortness of breath.   Musculoskeletal:  Negative for muscle cramps and myalgias.  Gastrointestinal:  Negative for abdominal pain, constipation, diarrhea, hematemesis, hematochezia, melena, nausea and vomiting.  Neurological:  Negative for  dizziness and light-headedness.   PHYSICAL EXAM: Vitals with BMI 10/30/2021 07/26/2020 06/28/2020  Height 5\' 9"  5\' 9"  5\' 9"   Weight 199 lbs 3 oz 201 lbs -  BMI 76.7 20.94 -  Systolic 709 628 366  Diastolic 72 74 77  Pulse 85 74 72   CONSTITUTIONAL: Well-developed and well-nourished. No acute distress.  SKIN: Skin is warm and dry. No rash noted. No cyanosis. No pallor. No jaundice HEAD: Normocephalic and atraumatic.  EYES: No scleral icterus MOUTH/THROAT: Moist oral membranes.  NECK: No JVD present. No thyromegaly noted. No carotid bruits  LYMPHATIC: No visible cervical adenopathy.  CHEST Normal respiratory effort. No intercostal retractions  LUNGS: Clear to auscultation bilaterally.  No stridor. No wheezes. No rales.  CARDIOVASCULAR: Irregularly irregular, variable Q9-U7, soft holosystolic murmur heard at the apex, no gallops or rubs. ABDOMINAL: No apparent ascites.  EXTREMITIES: No peripheral edema  HEMATOLOGIC: No significant bruising NEUROLOGIC: Oriented to person, place, and time. Nonfocal. Normal muscle tone.  PSYCHIATRIC: Normal mood and affect. Normal behavior. Cooperative  CARDIAC DATABASE: EKG: 06/28/2020: Atrial fibrillation, 97 bpm, RBBB, without underlying injury pattern.  07/26/2020: Atrial fibrillation, 85 bpm, normal axis, incomplete right bundle branch block, without underlying injury pattern. 10/30/2021: Atrial fibrillation, 80 bpm, normal axis, poor R wave progression, without underlying injury pattern.  Echocardiogram: 07/10/2020: Left ventricle cavity is normal in size and wall thickness. Normal LV systolic function with EF 60%. Mild inferior/inferolateral hypokinesis. Unable to evaluate diastolic function due to atrial fibrillation.  Mild to moderate mitral regurgitation. Mild tricuspid regurgitation.  No evidence of pulmonary hypertension.   Stress Testing: No results found for this or any previous visit from the past 1095 days.  Heart  Catheterization: None  LABORATORY DATA: CBC Latest Ref Rng & Units 08/15/2020 01/24/2020 01/23/2020  WBC 4.0 - 10.5 K/uL - 6.8 11.1(H)  Hemoglobin 13.0 - 17.7 g/dL 14.5 12.2(L) 14.4  Hematocrit 37.5 - 51.0 % 43.8 36.7(L) 43.7  Platelets 150 - 400 K/uL - 169 223    CMP Latest Ref Rng & Units 08/25/2020 08/15/2020 01/24/2020  Glucose 65 - 99 mg/dL 117(H) 134(H) 123(H)  BUN 8 - 27 mg/dL 19 18 15   Creatinine 0.76 - 1.27 mg/dL 1.43(H) 1.62(H) 1.37(H)  Sodium 134 - 144 mmol/L 140 142 138  Potassium 3.5 - 5.2 mmol/L 4.4 4.7 3.6  Chloride 96 - 106 mmol/L 99 98 104  CO2 20 - 29 mmol/L 28 29 23   Calcium 8.6 - 10.2 mg/dL 9.1 9.9 8.3(L)  Total Protein 6.5 - 8.1 g/dL - - -  Total Bilirubin 0.3 - 1.2 mg/dL - - -  Alkaline Phos 38 - 126 U/L - - -  AST 15 - 41 U/L - - -  ALT 0 - 44 U/L - - -    Lipid Panel  No results found for: CHOL, TRIG, HDL, CHOLHDL, VLDL, LDLCALC, LDLDIRECT, LABVLDL  No components found for: NTPROBNP No results for input(s): PROBNP in the last 8760 hours. No results for input(s): TSH in the last 8760 hours.  BMP No results for input(s): NA, K, CL, CO2, GLUCOSE, BUN, CREATININE, CALCIUM, GFRNONAA, GFRAA in the last 8760 hours.   HEMOGLOBIN A1C Lab Results  Component Value Date  HGBA1C 6.0 (H) 01/24/2020   MPG 126 01/24/2020   External Labs: Collected: 06/14/2021 [facility] Hemoglobin 15.1 g/dL, hematocrit 44.3%. Total cholesterol 180, triglycerides 253, HDL 63, LDL 93, non-HDL 144 Magnesium 2.1 TSH 2.768 Sodium 139, potassium 4.4, chloride 101, bicarb 30, BUN 25, creatinine 1.65 AST 22, ALT 25, alkaline phosphatase 67  IMPRESSION:    ICD-10-CM   1. Longstanding persistent atrial fibrillation (HCC)  I48.11 EKG 12-Lead    PCV MYOCARDIAL PERFUSION WO LEXISCAN    2. Long term (current) use of anticoagulants  Z79.01     3. Benign hypertension with CKD (chronic kidney disease) stage III (HCC)  I12.9    N18.30     4.  Hypertriglyceridemia  E78.1     5. Hypercholesteremia  E78.00     6. Abnormal echocardiogram  R93.1 PCV MYOCARDIAL PERFUSION WO LEXISCAN      RECOMMENDATIONS: Darryl Powers is a 76 y.o. male whose past medical history and cardiac risk factors include: Atrial fibrillation, hypertension, hypercholesterolemia, chronic kidney disease stage III, prediabetes, advanced age.  Longstanding persistent atrial fibrillation (HCC) Rate control: Toprol-XL. Rhythm control: N/A. Thromboembolic prophylaxis: Eliquis Readdressed the importance of restoring normal sinus rhythm if possible with either antiarrhythmic medications versus cardioversion.  He would like some additional time to rethink his options prior to making a decision.  We discussed the risks, benefits, and alternatives to direct-current cardioversion after uninterrupted 4 weeks of oral anticoagulation as well as TEE guided cardioversion.  He will let the office know with his decisions.  Long term (current) use of anticoagulants Indication: Atrial fibrillation. CHA2DS2-VASc SCORE is 3 which correlates to 3.2 % risk of stroke per year. Most recent labs from September 2022 available in [place] reviewed notes stable hemoglobin. Patient does not endorse any evidence of bleeding.  Benign hypertension with CKD (chronic kidney disease) stage III (HCC) Blood pressures are well controlled. Currently managed by primary care provider.  Hypertriglyceridemia Labs from September 2022 note that the triglyceride levels are not well controlled. Currently working with PCP and has upcoming labs for follow-up.  Hypercholesteremia Currently on pravastatin.   He denies myalgia or other side effects. Most recent lipids dated 06/2021 available in [place] independently reviewed, noted above. Currently managed by primary care provider.  Abnormal echocardiogram Echocardiogram in September 2021 noted concern for regional wall motion abnormalities  in the inferior and inferolateral region. Recommend nuclear stress test to evaluate for reversible ischemia -given the echo findings, diagnosis of atrial fibrillation, and elevated 10-year risk of ASCVD.  FINAL MEDICATION LIST END OF ENCOUNTER: No orders of the defined types were placed in this encounter.   Current Outpatient Medications:    ELIQUIS 5 MG TABS tablet, Take 5 mg by mouth 2 (two) times daily., Disp: , Rfl:    hydrochlorothiazide (HYDRODIURIL) 12.5 MG tablet, TAKE 2 TABLETS(25 MG) BY MOUTH IN THE MORNING, Disp: 180 tablet, Rfl: 0   losartan (COZAAR) 100 MG tablet, TAKE 1 TABLET(100 MG) BY MOUTH EVERY EVENING, Disp: 90 tablet, Rfl: 0   metoprolol succinate (TOPROL-XL) 25 MG 24 hr tablet, TAKE 1 TABLET(25 MG) BY MOUTH DAILY, Disp: 90 tablet, Rfl: 0   pravastatin (PRAVACHOL) 20 MG tablet, Take 20 mg by mouth daily., Disp: , Rfl:    psyllium (METAMUCIL SMOOTH TEXTURE) 28 % packet, Take 1 packet by mouth 2 (two) times daily., Disp: , Rfl:   Orders Placed This Encounter  Procedures   PCV MYOCARDIAL PERFUSION WO LEXISCAN   EKG 12-Lead  There are no Patient Instructions on file for this visit.   --Continue cardiac medications as reconciled in final medication list. --Return in about 6 weeks (around 12/11/2021) for Follow up, A. fib, Review test results. Or sooner if needed. --Continue follow-up with your primary care physician regarding the management of your other chronic comorbid conditions.  Patient's questions and concerns were addressed to his satisfaction. He voices understanding of the instructions provided during this encounter.   This note was created using a voice recognition software as a result there may be grammatical errors inadvertently enclosed that do not reflect the nature of this encounter. Every attempt is made to correct such errors.  Rex Kras, [place], [facility]  Pager: [phone] Office: [phone]

## 2021-11-26 ENCOUNTER — Ambulatory Visit: Payer: Medicare Other

## 2021-11-26 ENCOUNTER — Other Ambulatory Visit: Payer: Self-pay

## 2021-11-26 DIAGNOSIS — I4811 Longstanding persistent atrial fibrillation: Secondary | ICD-10-CM

## 2021-11-26 DIAGNOSIS — R931 Abnormal findings on diagnostic imaging of heart and coronary circulation: Secondary | ICD-10-CM

## 2021-11-30 NOTE — Progress Notes (Signed)
Called and spoke with patient regarding his stress test results.

## 2021-12-11 ENCOUNTER — Other Ambulatory Visit: Payer: Self-pay

## 2021-12-11 ENCOUNTER — Encounter: Payer: Self-pay | Admitting: Cardiology

## 2021-12-11 ENCOUNTER — Ambulatory Visit: Payer: Medicare Other | Admitting: Cardiology

## 2021-12-11 VITALS — BP 118/70 | HR 74 | Temp 97.8°F | Resp 16 | Ht 69.0 in | Wt 201.2 lb

## 2021-12-11 DIAGNOSIS — I4811 Longstanding persistent atrial fibrillation: Secondary | ICD-10-CM

## 2021-12-11 DIAGNOSIS — I129 Hypertensive chronic kidney disease with stage 1 through stage 4 chronic kidney disease, or unspecified chronic kidney disease: Secondary | ICD-10-CM

## 2021-12-11 DIAGNOSIS — E7800 Pure hypercholesterolemia, unspecified: Secondary | ICD-10-CM

## 2021-12-11 DIAGNOSIS — Z7901 Long term (current) use of anticoagulants: Secondary | ICD-10-CM

## 2021-12-11 DIAGNOSIS — E781 Pure hyperglyceridemia: Secondary | ICD-10-CM

## 2021-12-11 NOTE — Progress Notes (Signed)
ID:  Darryl Powers, DOB Mar 21, 1946, MRN [id number]  PCP:  Bartholome Bill, MD  Cardiologist:  Rex Kras, DO, [facility] (established care 06/28/2020)  Date: 12/11/21 Last Office Visit: 10/30/2021  Chief Complaint  Patient presents with   Atrial Fibrillation   Results   Follow-up    HPI  Darryl Powers is a 76 y.o. male who presents to the office with a chief complaint of " follow-up for atrial fibrillation management." Patient's past medical history and cardiovascular risk factors include: Hypertension, hypercholesterolemia, chronic kidney disease stage III, prediabetes, advanced age.  Referred to the practice back in September 2021 for management of atrial fibrillation.  With regards to his A-fib management he is currently on rate control strategy and oral anticoagulation for thromboembolic prophylaxis.  In the past we have discussed the importance of considering restoration of normal sinus rhythm either direct-current cardioversion or antiarrhythmic medications.  However, patient states that he is currently asymptomatic and would like to continue current therapy.  He does not endorse any evidence of bleeding.  No prior history of intracranial bleeding or gastrointestinal bleeding.  Since last office visit patient did undergo exercise nuclear stress test given his abnormal echocardiogram which noted concerns for regional wall motion abnormality.  Patient is noted to have low risk stress test results as well as images reviewed with him in great detail and noted below for further reference.  FUNCTIONAL STATUS: Three days a week goes to the gym and works on Editor, commissioning, elliptical, and treadmill.    ALLERGIES: Allergies  Allergen Reactions   Ace Inhibitors Cough   Amoxicillin-Pot Clavulanate Rash    MEDICATION LIST PRIOR TO VISIT: Current Meds  Medication Sig   ELIQUIS 5 MG TABS tablet Take 5 mg by mouth 2 (two) times daily.   hydrochlorothiazide (HYDRODIURIL) 12.5 MG  tablet TAKE 2 TABLETS(25 MG) BY MOUTH IN THE MORNING   losartan (COZAAR) 100 MG tablet TAKE 1 TABLET(100 MG) BY MOUTH EVERY EVENING   metoprolol succinate (TOPROL-XL) 25 MG 24 hr tablet TAKE 1 TABLET(25 MG) BY MOUTH DAILY   pravastatin (PRAVACHOL) 20 MG tablet Take 20 mg by mouth daily.   psyllium (METAMUCIL SMOOTH TEXTURE) 28 % packet Take 1 packet by mouth 2 (two) times daily.     PAST MEDICAL HISTORY: Past Medical History:  Diagnosis Date   Atrial fibrillation ([place])    Diverticulitis    Hyperlipidemia    Hypertension    Melanoma ([place])     PAST SURGICAL HISTORY: Past Surgical History:  Procedure Laterality Date   REDUCTION OF TORSION OF TESTIS     TENDON REPAIR Left 04/13/2016   Procedure: REPAIR TENDON NERVE INDEX AND LONG FINGER;  Surgeon: Dayna Barker, MD;  Location: [place];  Service: Plastics;  Laterality: Left;    FAMILY HISTORY: The patient family history includes Aneurysm in his father; Leukemia in his brother; Stroke in his mother.  SOCIAL HISTORY:  The patient  reports that he has never smoked. He has never used smokeless tobacco. He reports that he does not drink alcohol and does not use drugs.  REVIEW OF SYSTEMS: Review of Systems  Constitutional: Negative for chills and fever.  HENT:  Negative for hoarse voice and nosebleeds.   Eyes:  Negative for discharge, double vision and pain.  Cardiovascular:  Negative for chest pain, claudication, dyspnea on exertion, leg swelling, near-syncope, orthopnea, palpitations, paroxysmal nocturnal dyspnea and syncope.  Respiratory:  Negative for hemoptysis and shortness of breath.   Musculoskeletal:  Negative  for muscle cramps and myalgias.  Gastrointestinal:  Negative for abdominal pain, constipation, diarrhea, hematemesis, hematochezia, melena, nausea and vomiting.  Neurological:  Negative for dizziness and light-headedness.   PHYSICAL EXAM: Vitals with BMI 12/11/2021 10/30/2021 07/26/2020  Height 5\' 9"  5\' 9"  5\' 9"   Weight 201  lbs 3 oz 199 lbs 3 oz 201 lbs  BMI 29.7 57.8 46.96  Systolic 295 284 132  Diastolic 70 72 74  Pulse 74 85 74   CONSTITUTIONAL: Well-developed and well-nourished. No acute distress.  SKIN: Skin is warm and dry. No rash noted. No cyanosis. No pallor. No jaundice HEAD: Normocephalic and atraumatic.  EYES: No scleral icterus MOUTH/THROAT: Moist oral membranes.  NECK: No JVD present. No thyromegaly noted. No carotid bruits  LYMPHATIC: No visible cervical adenopathy.  CHEST Normal respiratory effort. No intercostal retractions  LUNGS: Clear to auscultation bilaterally.  No stridor. No wheezes. No rales.  CARDIOVASCULAR: Irregularly irregular, variable G4-W1, soft holosystolic murmur heard at the apex, no gallops or rubs. ABDOMINAL: Soft, nontender, nondistended, positive bowel sounds in all 4 quadrants, no apparent ascites.  EXTREMITIES: No peripheral edema  HEMATOLOGIC: No significant bruising NEUROLOGIC: Oriented to person, place, and time. Nonfocal. Normal muscle tone.  PSYCHIATRIC: Normal mood and affect. Normal behavior. Cooperative  CARDIAC DATABASE: EKG: 06/28/2020: Atrial fibrillation, 97 bpm, RBBB, without underlying injury pattern.  07/26/2020: Atrial fibrillation, 85 bpm, normal axis, incomplete right bundle branch block, without underlying injury pattern. 10/30/2021: Atrial fibrillation, 80 bpm, normal axis, poor R wave progression, without underlying injury pattern.  Echocardiogram: 07/10/2020: Left ventricle cavity is normal in size and wall thickness. Normal LV systolic function with EF 60%. Mild inferior/inferolateral hypokinesis. Unable to evaluate diastolic function due to atrial fibrillation.  Mild to moderate mitral regurgitation. Mild tricuspid regurgitation.  No evidence of pulmonary hypertension.   Stress Testing: Exercise nuclear stress test 11/26/2021: Normal ECG stress. Resting EKG demonstrated atrial fibrillation. Observed incomplete right bundle branch block.  Non-specific T-wave abnormalities. Peak EKG demonstrated atrial fibrillation. No significant ST-T change from baseline abnormality. The calculated [facility] Treadmill Score is 8.02. No chest pain. Normal BP response. THR achieved.  Overall LV systolic function is normal without regional wall motion abnormalities. Myocardial perfusion is normal. Stress LV EF: 57%.  No previous exam available for comparison. Low risk.   Heart Catheterization: None  LABORATORY DATA: CBC Latest Ref Rng & Units 08/15/2020 01/24/2020 01/23/2020  WBC 4.0 - 10.5 K/uL - 6.8 11.1(H)  Hemoglobin 13.0 - 17.7 g/dL 14.5 12.2(L) 14.4  Hematocrit 37.5 - 51.0 % 43.8 36.7(L) 43.7  Platelets 150 - 400 K/uL - 169 223    CMP Latest Ref Rng & Units 08/25/2020 08/15/2020 01/24/2020  Glucose 65 - 99 mg/dL 117(H) 134(H) 123(H)  BUN 8 - 27 mg/dL 19 18 15   Creatinine 0.76 - 1.27 mg/dL 1.43(H) 1.62(H) 1.37(H)  Sodium 134 - 144 mmol/L 140 142 138  Potassium 3.5 - 5.2 mmol/L 4.4 4.7 3.6  Chloride 96 - 106 mmol/L 99 98 104  CO2 20 - 29 mmol/L 28 29 23   Calcium 8.6 - 10.2 mg/dL 9.1 9.9 8.3(L)  Total Protein 6.5 - 8.1 g/dL - - -  Total Bilirubin 0.3 - 1.2 mg/dL - - -  Alkaline Phos 38 - 126 U/L - - -  AST 15 - 41 U/L - - -  ALT 0 - 44 U/L - - -    Lipid Panel  No results found for: CHOL, TRIG, HDL, CHOLHDL, VLDL, LDLCALC, LDLDIRECT, LABVLDL  No components found for:  NTPROBNP No results for input(s): PROBNP in the last 8760 hours. No results for input(s): TSH in the last 8760 hours.  BMP No results for input(s): NA, K, CL, CO2, GLUCOSE, BUN, CREATININE, CALCIUM, GFRNONAA, GFRAA in the last 8760 hours.   HEMOGLOBIN A1C Lab Results  Component Value Date   HGBA1C 6.0 (H) 01/24/2020   MPG 126 01/24/2020   External Labs: Collected: 06/14/2021 [facility] Hemoglobin 15.1 g/dL, hematocrit 44.3%. Total cholesterol 180, triglycerides 253, HDL 63, LDL 93, non-HDL 144 Magnesium 2.1 TSH 2.768 Sodium 139,  potassium 4.4, chloride 101, bicarb 30, BUN 25, creatinine 1.65 AST 22, ALT 25, alkaline phosphatase 67  IMPRESSION:    ICD-10-CM   1. Longstanding persistent atrial fibrillation (HCC)  I48.11     2. Long term (current) use of anticoagulants  Z79.01 Hemoglobin and hematocrit, blood    Basic metabolic panel    3. Benign hypertension with CKD (chronic kidney disease) stage III (HCC)  I12.9    N18.30     4. Hypertriglyceridemia  E78.1     5. Hypercholesteremia  E78.00       RECOMMENDATIONS: Darryl Powers is a 76 y.o. male whose past medical history and cardiac risk factors include: Atrial fibrillation, hypertension, hypercholesterolemia, chronic kidney disease stage III, prediabetes, advanced age.  Longstanding persistent atrial fibrillation (HCC) Rate control: Toprol-XL Rhythm control: N/A. Thromboembolic prophylaxis: Eliquis We have discussed the importance of expiratory normal sinus rhythm if possible.  Since last office visit he is also discuss it further with his wife and kids in the shared decision was to continue current medical therapy.  Long term (current) use of anticoagulants Indication: Atrial fibrillation. CHA2DS2-VASc SCORE is 3 which correlates to 3.2% risk of stroke per year. Recommend checking hemoglobin and hematocrit and BMP prior to the next office visit.  Labs ordered and released Patient does not endorse any evidence of bleeding.   Reemphasized the risks, benefits, and alternatives to oral anticoagulation  Benign hypertension with CKD (chronic kidney disease) stage III ([place]) Office blood pressures are very well controlled. Medications reconciled. Continue current medical therapy.  Hypertriglyceridemia Currently being monitored by PCP.   Hypercholesteremia Currently on pravastatin.   He denies myalgia or other side effects. Most recent lipids dated September 2022 from care everywhere reviewed as noted above. Currently managed by primary care  provider.  FINAL MEDICATION LIST END OF ENCOUNTER: No orders of the defined types were placed in this encounter.   Current Outpatient Medications:    ELIQUIS 5 MG TABS tablet, Take 5 mg by mouth 2 (two) times daily., Disp: , Rfl:    hydrochlorothiazide (HYDRODIURIL) 12.5 MG tablet, TAKE 2 TABLETS(25 MG) BY MOUTH IN THE MORNING, Disp: 180 tablet, Rfl: 0   losartan (COZAAR) 100 MG tablet, TAKE 1 TABLET(100 MG) BY MOUTH EVERY EVENING, Disp: 90 tablet, Rfl: 0   metoprolol succinate (TOPROL-XL) 25 MG 24 hr tablet, TAKE 1 TABLET(25 MG) BY MOUTH DAILY, Disp: 90 tablet, Rfl: 0   pravastatin (PRAVACHOL) 20 MG tablet, Take 20 mg by mouth daily., Disp: , Rfl:    psyllium (METAMUCIL SMOOTH TEXTURE) 28 % packet, Take 1 packet by mouth 2 (two) times daily., Disp: , Rfl:   Orders Placed This Encounter  Procedures   Hemoglobin and hematocrit, blood   Basic metabolic panel   There are no Patient Instructions on file for this visit.   --Continue cardiac medications as reconciled in final medication list. --Return in about 6 months (around 06/10/2022) for  Follow up, A. fib. Or sooner if needed. --Continue follow-up with your primary care physician regarding the management of your other chronic comorbid conditions.  Patient's questions and concerns were addressed to his satisfaction. He voices understanding of the instructions provided during this encounter.   This note was created using a voice recognition software as a result there may be grammatical errors inadvertently enclosed that do not reflect the nature of this encounter. Every attempt is made to correct such errors.  Rex Kras, [place], [facility]  Pager: [phone] Office: [phone]

## 2022-03-23 LAB — HEMOGLOBIN AND HEMATOCRIT, BLOOD
Hematocrit: 42.4 % (ref 37.5–51.0)
Hemoglobin: 14.6 g/dL (ref 13.0–17.7)

## 2022-03-23 LAB — BASIC METABOLIC PANEL
BUN/Creatinine Ratio: 13 (ref 10–24)
BUN: 18 mg/dL (ref 8–27)
CO2: 25 mmol/L (ref 20–29)
Calcium: 9.1 mg/dL (ref 8.6–10.2)
Chloride: 104 mmol/L (ref 96–106)
Creatinine, Ser: 1.36 mg/dL — ABNORMAL HIGH (ref 0.76–1.27)
Glucose: 150 mg/dL — ABNORMAL HIGH (ref 70–99)
Potassium: 4.4 mmol/L (ref 3.5–5.2)
Sodium: 146 mmol/L — ABNORMAL HIGH (ref 134–144)
eGFR: 53 mL/min/{1.73_m2} — ABNORMAL LOW (ref >59)

## 2022-03-26 NOTE — Progress Notes (Signed)
Called pt, ptstated that you told her if she was doing okay and has no problems that she could wait 6 more months until her next office visit. She wants to know if she still needs to be seen 06/10/2022 at 1:30 PM. Please advise.

## 2022-04-07 ENCOUNTER — Other Ambulatory Visit: Payer: Self-pay | Admitting: Cardiology

## 2022-04-07 DIAGNOSIS — I4891 Unspecified atrial fibrillation: Secondary | ICD-10-CM

## 2022-04-18 ENCOUNTER — Other Ambulatory Visit: Payer: Self-pay

## 2022-04-18 MED ORDER — HYDROCHLOROTHIAZIDE 12.5 MG PO TABS: 12.5000 mg | ORAL_TABLET | Freq: Every day | ORAL | 0 refills | Status: DC

## 2022-04-22 ENCOUNTER — Other Ambulatory Visit: Payer: Self-pay

## 2022-04-22 DIAGNOSIS — Z7901 Long term (current) use of anticoagulants: Secondary | ICD-10-CM

## 2022-04-22 NOTE — Progress Notes (Signed)
Called pt, no answer. Left vm requesting call back?

## 2022-04-24 NOTE — Progress Notes (Signed)
Called patient on both home and mobile #'s, NA, LMAM

## 2022-06-10 ENCOUNTER — Ambulatory Visit: Payer: Medicare Other | Admitting: Cardiology

## 2022-07-22 ENCOUNTER — Other Ambulatory Visit: Payer: Self-pay | Admitting: Cardiology

## 2022-07-22 DIAGNOSIS — I4891 Unspecified atrial fibrillation: Secondary | ICD-10-CM

## 2022-10-11 ENCOUNTER — Other Ambulatory Visit: Payer: Self-pay | Admitting: Cardiology

## 2022-10-11 DIAGNOSIS — I4891 Unspecified atrial fibrillation: Secondary | ICD-10-CM

## 2022-10-22 ENCOUNTER — Other Ambulatory Visit: Payer: Self-pay | Admitting: Cardiology

## 2022-12-11 ENCOUNTER — Ambulatory Visit: Payer: Medicare Other | Admitting: Cardiology

## 2022-12-16 ENCOUNTER — Ambulatory Visit: Payer: Medicare Other | Admitting: Cardiology

## 2022-12-16 ENCOUNTER — Encounter: Payer: Self-pay | Admitting: Cardiology

## 2022-12-16 VITALS — BP 140/72 | HR 72 | Resp 16 | Ht 69.0 in | Wt 204.0 lb

## 2022-12-16 DIAGNOSIS — E7800 Pure hypercholesterolemia, unspecified: Secondary | ICD-10-CM

## 2022-12-16 DIAGNOSIS — Z7901 Long term (current) use of anticoagulants: Secondary | ICD-10-CM

## 2022-12-16 DIAGNOSIS — N183 Chronic kidney disease, stage 3 unspecified: Secondary | ICD-10-CM

## 2022-12-16 DIAGNOSIS — E781 Pure hyperglyceridemia: Secondary | ICD-10-CM

## 2022-12-16 DIAGNOSIS — I4811 Longstanding persistent atrial fibrillation: Secondary | ICD-10-CM

## 2022-12-16 NOTE — Progress Notes (Signed)
ID:  Darryl Powers, DOB 1946-07-30, MRN [id number]  PCP:  Bartholome Bill, MD  Cardiologist:  Rex Kras, DO, [facility] (established care 06/28/2020)  Date: 12/16/22 Last Office Visit: 10/30/2021  Chief Complaint  Patient presents with   Atrial Fibrillation   Follow-up    HPI  Darryl Powers is a 77 y.o. male whose past medical history and cardiovascular risk factors include: Afib, Hypertension, hypercholesterolemia, chronic kidney disease stage III, prediabetes, advanced age.  Referred to the practice back in September 2021 for management of atrial fibrillation.  Since then he has been on rate control strategy and thromboembolic prophylaxis.  We have discussed at multiple office visits the importance and consideration of restoring normal sinus rhythm over the last several years he is asymptomatic and prefers to remain in A-fib.  He presents today for 1 year follow-up visit.  He denies anginal discomfort or heart failure symptoms.  He does not endorse any evidence of bleeding.  No prior history of intracranial or gastrointestinal bleeding.  FUNCTIONAL STATUS: Three days a week goes to the gym and works on Editor, commissioning, elliptical, and treadmill.    ALLERGIES: Allergies  Allergen Reactions   Ace Inhibitors Cough   Amoxicillin-Pot Clavulanate Rash    MEDICATION LIST PRIOR TO VISIT: Current Meds  Medication Sig   ELIQUIS 5 MG TABS tablet Take 5 mg by mouth 2 (two) times daily.   hydrochlorothiazide (HYDRODIURIL) 12.5 MG tablet TAKE 1 TABLET BY MOUTH DAILY   losartan (COZAAR) 100 MG tablet TAKE 1 TABLET(100 MG) BY MOUTH EVERY EVENING (Patient taking differently: Take 50 mg by mouth daily.)   metoprolol succinate (TOPROL-XL) 25 MG 24 hr tablet TAKE 1 TABLET(25 MG) BY MOUTH DAILY   pravastatin (PRAVACHOL) 20 MG tablet Take 20 mg by mouth daily.   psyllium (METAMUCIL SMOOTH TEXTURE) 28 % packet Take 1 packet by mouth 2 (two) times daily.     PAST MEDICAL HISTORY: Past  Medical History:  Diagnosis Date   Atrial fibrillation ([place])    Diverticulitis    Hyperlipidemia    Hypertension    Melanoma ([place])     PAST SURGICAL HISTORY: Past Surgical History:  Procedure Laterality Date   REDUCTION OF TORSION OF TESTIS     TENDON REPAIR Left 04/13/2016   Procedure: REPAIR TENDON NERVE INDEX AND LONG FINGER;  Surgeon: Dayna Barker, MD;  Location: [place];  Service: Plastics;  Laterality: Left;    FAMILY HISTORY: The patient family history includes Aneurysm in his father; Leukemia in his brother; Stroke in his mother.  SOCIAL HISTORY:  The patient  reports that he has never smoked. He has never used smokeless tobacco. He reports that he does not drink alcohol and does not use drugs.  REVIEW OF SYSTEMS: Review of Systems  Constitutional: Negative for chills and fever.  HENT:  Negative for hoarse voice and nosebleeds.   Eyes:  Negative for discharge, double vision and pain.  Cardiovascular:  Negative for chest pain, claudication, dyspnea on exertion, leg swelling, near-syncope, orthopnea, palpitations, paroxysmal nocturnal dyspnea and syncope.  Respiratory:  Negative for hemoptysis and shortness of breath.   Musculoskeletal:  Negative for muscle cramps and myalgias.  Gastrointestinal:  Negative for abdominal pain, constipation, diarrhea, hematemesis, hematochezia, melena, nausea and vomiting.  Neurological:  Negative for dizziness and light-headedness.    PHYSICAL EXAM:    12/16/2022    9:44 AM 12/11/2021    1:38 PM 10/30/2021    1:58 PM  Vitals with BMI  Height  $'5\' 9"'B$  '5\' 9"'$  '5\' 9"'$   Weight 204 lbs 201 lbs 3 oz 199 lbs 3 oz  BMI 30.11 [id number]  Systolic [id number]  Diastolic 72 70 72  Pulse 72 74 85   Physical Exam  Constitutional: No distress.  Age appropriate, hemodynamically stable.   Neck: No JVD present.  Cardiovascular: Normal rate, S1 normal, S2 normal, intact distal pulses and normal pulses. An irregularly irregular rhythm present. Exam reveals  no gallop, no S3 and no S4.  Murmur heard. Holosystolic murmur is present with a grade of 3/6 at the apex. Pulmonary/Chest: Effort normal and breath sounds normal. No stridor. He has no wheezes. He has no rales.  Abdominal: Soft. Bowel sounds are normal. He exhibits no distension. There is no abdominal tenderness.  Musculoskeletal:        General: No edema.     Cervical back: Neck supple.  Neurological: He is alert and oriented to person, place, and time. He has intact cranial nerves (2-12).  Skin: Skin is warm and moist.   CARDIAC DATABASE: EKG: 12/16/2022: Atrial fibrillation with controlled ventricular rate, incomplete right bundle branch block, no change compared to 10/30/2021.  Echocardiogram: 07/10/2020: Left ventricle cavity is normal in size and wall thickness. Normal LV systolic function with EF 60%. Mild inferior/inferolateral hypokinesis. Unable to evaluate diastolic function due to atrial fibrillation.  Mild to moderate mitral regurgitation. Mild tricuspid regurgitation.  No evidence of pulmonary hypertension.   Stress Testing: Exercise nuclear stress test 11/26/2021: Normal ECG stress. Resting EKG demonstrated atrial fibrillation. Observed incomplete right bundle branch block. Non-specific T-wave abnormalities. Peak EKG demonstrated atrial fibrillation. No significant ST-T change from baseline abnormality. The calculated [facility] Treadmill Score is 8.02. No chest pain. Normal BP response. THR achieved.  Overall LV systolic function is normal without regional wall motion abnormalities. Myocardial perfusion is normal. Stress LV EF: 57%.  No previous exam available for comparison. Low risk.   Heart Catheterization: None  LABORATORY DATA:    Latest Ref Rng & Units 03/22/2022    1:10 PM 08/15/2020    1:54 PM 01/24/2020    4:59 AM  CBC  WBC 4.0 - 10.5 K/uL   6.8   Hemoglobin 13.0 - 17.7 g/dL 14.6  14.5  12.2   Hematocrit 37.5 - 51.0 % 42.4  43.8  36.7   Platelets 150 - 400 K/uL    169        Latest Ref Rng & Units 03/22/2022    1:10 PM 08/25/2020    1:48 PM 08/15/2020    1:54 PM  CMP  Glucose 70 - 99 mg/dL 150  117  134   BUN 8 - 27 mg/dL '18  19  18   '$ Creatinine 0.76 - 1.27 mg/dL 1.36  1.43  1.62   Sodium 134 - 144 mmol/L 146  140  142   Potassium 3.5 - 5.2 mmol/L 4.4  4.4  4.7   Chloride 96 - 106 mmol/L 104  99  98   CO2 20 - 29 mmol/L '25  28  29   '$ Calcium 8.6 - 10.2 mg/dL 9.1  9.1  9.9     Lipid Panel  No results found for: "CHOL", "TRIG", "HDL", "CHOLHDL", "VLDL", "LDLCALC", "LDLDIRECT", "LABVLDL"  No components found for: "NTPROBNP" No results for input(s): "PROBNP" in the last 8760 hours. No results for input(s): "TSH" in the last 8760 hours.  BMP Recent Labs    03/22/22 1310  NA 146*  K 4.4  CL 104  CO2 25  GLUCOSE 150*  BUN 18  CREATININE 1.36*  CALCIUM 9.1     HEMOGLOBIN A1C Lab Results  Component Value Date   HGBA1C 6.0 (H) 01/24/2020   MPG 126 01/24/2020   External Labs: Collected: 06/14/2021 [facility] Hemoglobin 15.1 g/dL, hematocrit 44.3%. Total cholesterol 180, triglycerides 253, HDL 63, LDL 93, non-HDL 144 Magnesium 2.1 TSH 2.768 Sodium 139, potassium 4.4, chloride 101, bicarb 30, BUN 25, creatinine 1.65 AST 22, ALT 25, alkaline phosphatase 67  External Labs: Collected: November 12, 2022 [facility] available in [place]. Hemoglobin 15, hematocrit 45.1% A1c 6.2. Total cholesterol 156, triglycerides 210, HDL 37, LDL 77, non-HDL 119 Sodium 139, potassium 4.1, chloride 102, bicarb 30, BUN 18, creatinine 1.44. AST 22, ALT 23, alkaline phosphatase 61 eGFR 50  IMPRESSION:    ICD-10-CM   1. Longstanding persistent atrial fibrillation (HCC)  I48.11 EKG 12-Lead    Hemoglobin and hematocrit, blood    Basic metabolic panel    2. Long term (current) use of anticoagulants  Z79.01 Hemoglobin and hematocrit, blood    Basic metabolic panel    3. Benign hypertension with CKD (chronic  kidney disease) stage III (HCC)  I12.9    N18.30     4. Hypertriglyceridemia  E78.1     5. Hypercholesteremia  E78.00       RECOMMENDATIONS: ALBIN BIRDSELL is a 77 y.o. male whose past medical history and cardiac risk factors include: Atrial fibrillation, hypertension, hypercholesterolemia, chronic kidney disease stage III, prediabetes, advanced age.  Longstanding persistent atrial fibrillation (HCC) Likely permanent. Has not entertain the idea of rhythm control in the past. Rate control: Toprol-XL. Thromboembolic prophylaxis: Eliquis.  Long term (current) use of anticoagulants Indication: Persistent/permanent A-fib. CHA2DS2-VASc SCORE is 3 which correlates to 3.2% risk of stroke per year (age, hypertension).  Most recent labs from care everywhere reviewed.  H&H are stable. Renal function is stable  Benign hypertension with CKD (chronic kidney disease) stage III ([place]) Office blood pressures are above normal limits. Home blood pressures are better controlled. No change in medical therapy today.  Hypertriglyceridemia Hypercholesteremia Currently on pravastatin.   He denies myalgia or other side effects. Triglycerides are not well-controlled. Discussed pharmacological therapy but he would like to hold off for now. Most recent lipids dated January 2024, reviewed as noted above.  FINAL MEDICATION LIST END OF ENCOUNTER: No orders of the defined types were placed in this encounter.   Current Outpatient Medications:    ELIQUIS 5 MG TABS tablet, Take 5 mg by mouth 2 (two) times daily., Disp: , Rfl:    hydrochlorothiazide (HYDRODIURIL) 12.5 MG tablet, TAKE 1 TABLET BY MOUTH DAILY, Disp: 180 tablet, Rfl: 0   losartan (COZAAR) 100 MG tablet, TAKE 1 TABLET(100 MG) BY MOUTH EVERY EVENING (Patient taking differently: Take 50 mg by mouth daily.), Disp: 90 tablet, Rfl: 0   metoprolol succinate (TOPROL-XL) 25 MG 24 hr tablet, TAKE 1 TABLET(25 MG) BY MOUTH DAILY, Disp: 90 tablet, Rfl: 0    pravastatin (PRAVACHOL) 20 MG tablet, Take 20 mg by mouth daily., Disp: , Rfl:    psyllium (METAMUCIL SMOOTH TEXTURE) 28 % packet, Take 1 packet by mouth 2 (two) times daily., Disp: , Rfl:   Orders Placed This Encounter  Procedures   Hemoglobin and hematocrit, blood   Basic metabolic panel   EKG [id number]   There are no Patient Instructions on file for this visit.   --Continue cardiac medications as reconciled in final medication list. --  Return in about 1 year (around 12/16/2023) for Follow up, A. fib. Or sooner if needed. --Continue follow-up with your primary care physician regarding the management of your other chronic comorbid conditions.  Patient's questions and concerns were addressed to his satisfaction. He voices understanding of the instructions provided during this encounter.   This note was created using a voice recognition software as a result there may be grammatical errors inadvertently enclosed that do not reflect the nature of this encounter. Every attempt is made to correct such errors.  Rex Kras, [place], [facility]  Pager:  [phone] Office: [phone]

## 2023-01-30 ENCOUNTER — Other Ambulatory Visit: Payer: Self-pay | Admitting: Cardiology

## 2023-01-30 DIAGNOSIS — I4891 Unspecified atrial fibrillation: Secondary | ICD-10-CM

## 2023-04-08 ENCOUNTER — Other Ambulatory Visit: Payer: Self-pay | Admitting: Cardiology

## 2023-04-09 LAB — HEMOGLOBIN AND HEMATOCRIT, BLOOD
Hematocrit: 41.9 % (ref 37.5–51.0)
Hemoglobin: 13.7 g/dL (ref 13.0–17.7)

## 2023-04-09 LAB — BASIC METABOLIC PANEL
BUN/Creatinine Ratio: 15 (ref 10–24)
BUN: 22 mg/dL (ref 8–27)
CO2: 25 mmol/L (ref 20–29)
Calcium: 9.3 mg/dL (ref 8.6–10.2)
Chloride: 103 mmol/L (ref 96–106)
Creatinine, Ser: 1.47 mg/dL — ABNORMAL HIGH (ref 0.76–1.27)
Glucose: 132 mg/dL — ABNORMAL HIGH (ref 70–99)
Potassium: 3.9 mmol/L (ref 3.5–5.2)
Sodium: 143 mmol/L (ref 134–144)
eGFR: 48 mL/min/{1.73_m2} — ABNORMAL LOW (ref >59)

## 2023-04-09 NOTE — Progress Notes (Signed)
Discussed results with patient he verbalized understanding. 

## 2023-04-09 NOTE — Progress Notes (Signed)
LMTCB

## 2023-04-16 ENCOUNTER — Other Ambulatory Visit: Payer: Self-pay | Admitting: Cardiology

## 2023-05-02 ENCOUNTER — Other Ambulatory Visit: Payer: Self-pay | Admitting: Cardiology

## 2023-05-02 DIAGNOSIS — I4891 Unspecified atrial fibrillation: Secondary | ICD-10-CM

## 2023-05-30 DIAGNOSIS — N1831 Chronic kidney disease, stage 3a: Secondary | ICD-10-CM | POA: Insufficient documentation

## 2023-07-18 ENCOUNTER — Telehealth: Payer: Self-pay | Admitting: Cardiology

## 2023-07-18 NOTE — Telephone Encounter (Signed)
See other phone note This person was calling about wife's lab results ./cy

## 2023-07-18 NOTE — Telephone Encounter (Signed)
Patient called stating he was returning RN Christine's call.

## 2023-08-01 ENCOUNTER — Other Ambulatory Visit: Payer: Self-pay | Admitting: Cardiology

## 2023-08-01 DIAGNOSIS — I4891 Unspecified atrial fibrillation: Secondary | ICD-10-CM

## 2023-10-11 ENCOUNTER — Other Ambulatory Visit: Payer: Self-pay | Admitting: Cardiology

## 2023-12-16 ENCOUNTER — Ambulatory Visit: Payer: Self-pay | Admitting: Cardiology

## 2023-12-17 ENCOUNTER — Encounter: Payer: Self-pay | Admitting: Cardiology

## 2023-12-17 ENCOUNTER — Ambulatory Visit: Payer: Medicare Other | Attending: Cardiology | Admitting: Cardiology

## 2023-12-17 VITALS — BP 124/72 | HR 81 | Ht 69.0 in | Wt 198.0 lb

## 2023-12-17 DIAGNOSIS — I4811 Longstanding persistent atrial fibrillation: Secondary | ICD-10-CM

## 2023-12-17 DIAGNOSIS — E7800 Pure hypercholesterolemia, unspecified: Secondary | ICD-10-CM

## 2023-12-17 DIAGNOSIS — I129 Hypertensive chronic kidney disease with stage 1 through stage 4 chronic kidney disease, or unspecified chronic kidney disease: Secondary | ICD-10-CM | POA: Diagnosis not present

## 2023-12-17 DIAGNOSIS — Z7901 Long term (current) use of anticoagulants: Secondary | ICD-10-CM | POA: Diagnosis not present

## 2023-12-17 DIAGNOSIS — N183 Chronic kidney disease, stage 3 unspecified: Secondary | ICD-10-CM

## 2023-12-17 DIAGNOSIS — E781 Pure hyperglyceridemia: Secondary | ICD-10-CM | POA: Diagnosis not present

## 2023-12-17 MED ORDER — FENOFIBRATE 145 MG PO TABS: 145.0000 mg | ORAL_TABLET | Freq: Every day | ORAL | 3 refills | Status: AC

## 2023-12-17 NOTE — Progress Notes (Signed)
 Cardiology Office Note:  .   Date:  12/17/2023  ID:  Darryl Powers, DOB 07-20-1946, MRN [id number] PCP:  Darryl Au, MD  Former Cardiology Providers: [facility] Providers Cardiologist:  Darryl Lerner, DO , [facility] (established care 06/28/2020) Electrophysiologist:  None  Click to update primary MD,subspecialty MD or APP then REFRESH:1}    Chief Complaint  Patient presents with   Longstanding persistent atrial fibrillation   Follow-up    History of Present Illness: .   Darryl Powers is a 78 y.o.  male whose past medical history and cardiovascular risk factors includes: Afib, Hypertension, hypercholesterolemia, chronic kidney disease stage III, prediabetes, advanced age.   Patient was referred to the practice back in September 2021 for management of atrial fibrillation.  Since then he has been on rate control strategy and thromboembolic prophylaxis.  During prior office visits we have discussed consideration of restoring normal sinus rhythm with either addition of antiarrhythmics or considering procedures like cardioversion and/or EP evaluation for ablation.  However, patient would like to continue with medical therapy as overall he is asymptomatic.  He presents today for 1 year follow-up.  Over the last 1 year patient denies any anginal chest pain or heart failure symptoms.  No hospitalizations for cardiovascular reasons.  Remains physically active by going to the gym at least twice a week for 60 minutes and enjoys working out on elliptical/treadmill/strength training.  Review of Systems: .   Review of Systems  Constitutional: Negative for chills and fever.  HENT:  Negative for hoarse voice and nosebleeds.   Eyes:  Negative for discharge, double vision and pain.  Cardiovascular:  Negative for chest pain, claudication, dyspnea on exertion, irregular heartbeat, leg swelling, near-syncope, orthopnea, palpitations, paroxysmal nocturnal dyspnea and syncope.  Respiratory:   Negative for hemoptysis and shortness of breath.   Hematologic/Lymphatic: Negative for bleeding problem.  Musculoskeletal:  Negative for muscle cramps and myalgias.  Gastrointestinal:  Negative for abdominal pain, constipation, diarrhea, hematemesis, hematochezia, melena, nausea and vomiting.  Neurological:  Negative for dizziness and light-headedness.    Studies Reviewed:   EKG: EKG Interpretation Date/Time:  Wednesday December 17 2023 08:08:23 EST Ventricular Rate:  87 PR Interval:    QRS Duration:  128 QT Interval:  406 QTC Calculation: 488 R Axis:   51  Text Interpretation: Atrial fibrillation Right bundle branch block No significant change since last tracing Confirmed by Darryl Powers [phone]) on 12/17/2023 8:17:56 AM  Echocardiogram: 07/10/2020: Left ventricle cavity is normal in size and wall thickness. Normal LV systolic function with EF 60%. Mild inferior/inferolateral hypokinesis. Unable to evaluate diastolic function due to atrial fibrillation.  Mild to moderate mitral regurgitation. Mild tricuspid regurgitation.  No evidence of pulmonary hypertension.   Stress Testing: Exercise nuclear stress test 11/26/2021: Low risk.  RADIOLOGY: NA  Risk Assessment/Calculations:   Click Here to Calculate/Change CHADS2VASc Score The patient's CHADS2-VASc score is 3, indicating a 3.2% annual risk of stroke.     Labs:       Latest Ref Rng & Units 04/08/2023    3:49 PM 03/22/2022    1:10 PM 08/15/2020    1:54 PM  CBC  Hemoglobin 13.0 - 17.7 g/dL 52.8  41.3  24.4   Hematocrit 37.5 - 51.0 % 41.9  42.4  43.8        Latest Ref Rng & Units 04/08/2023    3:49 PM 03/22/2022    1:10 PM 08/25/2020    1:48 PM  BMP  Glucose 70 -  99 mg/dL 409  811  914   BUN 8 - 27 mg/dL 22  18  19    Creatinine 0.76 - 1.27 mg/dL 7.82  9.56  2.13   BUN/Creat Ratio 10 - 24 15  13  13    Sodium 134 - 144 mmol/L 143  146  140   Potassium 3.5 - 5.2 mmol/L 3.9  4.4  4.4   Chloride 96 - 106 mmol/L 103  104  99    CO2 20 - 29 mmol/L 25  25  28    Calcium 8.6 - 10.2 mg/dL 9.3  9.1  9.1       Latest Ref Rng & Units 04/08/2023    3:49 PM 03/22/2022    1:10 PM 08/25/2020    1:48 PM  CMP  Glucose 70 - 99 mg/dL 086  578  469   BUN 8 - 27 mg/dL 22  18  19    Creatinine 0.76 - 1.27 mg/dL 6.29  5.28  4.13   Sodium 134 - 144 mmol/L 143  146  140   Potassium 3.5 - 5.2 mmol/L 3.9  4.4  4.4   Chloride 96 - 106 mmol/L 103  104  99   CO2 20 - 29 mmol/L 25  25  28    Calcium 8.6 - 10.2 mg/dL 9.3  9.1  9.1     No results found for: "CHOL", "HDL", "LDLCALC", "LDLDIRECT", "TRIG", "CHOLHDL" No results for input(s): "LIPOA" in the last 8760 hours. No components found for: "NTPROBNP" No results for input(s): "PROBNP" in the last 8760 hours. No results for input(s): "TSH" in the last 8760 hours.  External Labs: Collected: November 12, 2022 [facility] available in Care Everywhere. Hemoglobin 15, hematocrit 45.1% A1c 6.2. Total cholesterol 156, triglycerides 210, HDL 37, LDL 77, non-HDL 119 Sodium 139, potassium 4.1, chloride 102, bicarb 30, BUN 18, creatinine 1.44. AST 22, ALT 23, alkaline phosphatase 61 eGFR 50  External Labs: Collected: 11/11/2023 [facility]. Sodium 139, potassium 4, chloride 101, bicarb 28. BUN 19, creatinine 1.48. eGFR 48. AST, ALT, alkaline phosphatase within normal limits Total cholesterol 138, triglycerides 213, HDL 32, LDL calculated 75, non-HDL 106 Hemoglobin 14.5, hematocrit 42%  Physical Exam:    Today's Vitals   12/17/23 0806  BP: 124/72  Pulse: 81  SpO2: 97%  Weight: 198 lb (89.8 kg)  Height: 5\' 9"  (1.753 m)   Body mass index is 29.24 kg/m. Wt Readings from Last 3 Encounters:  12/17/23 198 lb (89.8 kg)  12/16/22 204 lb (92.5 kg)  12/11/21 201 lb 3.2 oz (91.3 kg)    Physical Exam  Constitutional: No distress.  Age appropriate, hemodynamically stable.   Neck: No JVD present.  Cardiovascular: Normal rate, S1 normal, S2 normal, intact distal  pulses and normal pulses. An irregularly irregular rhythm present. Exam reveals no gallop, no S3 and no S4.  Murmur heard. Holosystolic murmur is present with a grade of 3/6 at the apex. Pulmonary/Chest: Effort normal and breath sounds normal. No stridor. He has no wheezes. He has no rales.  Abdominal: Soft. Bowel sounds are normal. He exhibits no distension. There is no abdominal tenderness.  Musculoskeletal:        General: No edema.     Cervical back: Neck supple.  Neurological: He is alert and oriented to person, place, and time. He has intact cranial nerves (2-12).  Skin: Skin is warm and moist.     Impression & Recommendation(s):  Impression:   ICD-10-CM   1.  Longstanding persistent atrial fibrillation (HCC)  I48.11 EKG 12-Lead    Comprehensive metabolic panel    ECHOCARDIOGRAM COMPLETE    CANCELED: ECHOCARDIOGRAM COMPLETE    2. Long term (current) use of anticoagulants  Z79.01     3. Benign hypertension with CKD (chronic kidney disease) stage III (HCC)  I12.9    N18.30     4. Hypertriglyceridemia  E78.1 fenofibrate (TRICOR) 145 MG tablet    5. Hypercholesteremia  E78.00 fenofibrate (TRICOR) 145 MG tablet    Lipid panel    Comprehensive metabolic panel       Recommendation(s):  Longstanding persistent atrial fibrillation (HCC) EKG illustrates rate controlled atrial fibrillation. In the past patient has not wanting to proceed forward with antiarrhythmics, direct-current cardioversion, or EP consult to consider restoration of sinus rhythm.  Rate control strategy for now. Currently on Toprol-XL 25 mg p.o. daily. Thromboembolic prophylaxis: Eliquis Plan echocardiogram prior to the next office visit.  Long term (current) use of anticoagulants Indication: Persistent/permanent atrial fibrillation. Is not endorse evidence of bleeding. Outside labs from Care Everywhere reviewed from January 2025  Benign hypertension with CKD (chronic kidney disease) stage III  (HCC) Office blood pressures are well-controlled. Continue losartan 50 mg p.o. every afternoon. Continue hydrochlorothiazide 12.5 mg p.o. every morning. Reemphasized importance of low-salt diet.  Hypertriglyceridemia Hypercholesteremia Currently on pravastatin 20 mg p.o. daily.  Most recent LDL 75 mg/dL. Triglycerides remain elevated for many years.  Patient is willing to consider pharmacological therapy and medications discussed. Start fenofibrate 145mg  p.o. daily. Follow-up labs in 6 weeks to reevaluate therapy  Orders Placed:  Orders Placed This Encounter  Procedures   Lipid panel   Comprehensive metabolic panel   EKG 12-Lead   ECHOCARDIOGRAM COMPLETE    Standing Status:   Future    Expected Date:   12/16/2024    Expiration Date:   12/16/2024    Where should this test be performed:   [facility])    Does the patient weigh less than or greater than 250 lbs?:   Patient weighs less than 250 lbs    Perflutren DEFINITY (image enhancing agent) should be administered unless hypersensitivity or allergy exist:   Administer Perflutren    Reason for exam-Echo:   Other-Full Diagnosis List    Full ICD-10/Reason for Exam:   Atrial fibrillation (HCC) [427.31.ICD-9-CM]    Final Medication List:    Meds ordered this encounter  Medications   fenofibrate (TRICOR) 145 MG tablet    Sig: Take 1 tablet (145 mg total) by mouth daily.    Dispense:  90 tablet    Refill:  3    There are no discontinued medications.   Current Outpatient Medications:    ELIQUIS 5 MG TABS tablet, Take 5 mg by mouth 2 (two) times daily., Disp: , Rfl:    fenofibrate (TRICOR) 145 MG tablet, Take 1 tablet (145 mg total) by mouth daily., Disp: 90 tablet, Rfl: 3   hydrochlorothiazide (HYDRODIURIL) 12.5 MG tablet, TAKE 1 TABLET BY MOUTH EVERY DAY, Disp: 180 tablet, Rfl: 0   losartan (COZAAR) 100 MG tablet, TAKE 1 TABLET(100 MG) BY MOUTH EVERY EVENING (Patient taking differently: Take 50 mg by mouth  daily.), Disp: 90 tablet, Rfl: 0   metoprolol succinate (TOPROL-XL) 25 MG 24 hr tablet, TAKE 1 TABLET(25 MG) BY MOUTH DAILY, Disp: 90 tablet, Rfl: 3   pravastatin (PRAVACHOL) 20 MG tablet, Take 20 mg by mouth daily., Disp: , Rfl:    psyllium (METAMUCIL SMOOTH TEXTURE) 28 %  packet, Take 1 packet by mouth 2 (two) times daily., Disp: , Rfl:   Consent:   NA  Disposition:   1 year follow-up sooner if needed Patient may be asked to follow-up sooner based on the results of the above-mentioned testing.  His questions and concerns were addressed to his satisfaction. He voices understanding of the recommendations provided during this encounter.    Signed, Darryl Lerner, DO, [facility]  [facility]  [address] [place], [place] [address] 12/17/2023 9:57 AM

## 2023-12-17 NOTE — Patient Instructions (Signed)
 Medication Instructions:  START Fenofibrate 145 mg take one tablet by mouth daily   *If you need a refill on your cardiac medications before your next appointment, please call your pharmacy*   Lab Work x6 weeks : Lipid Panel BMP  If you have labs (blood work) drawn today and your tests are completely normal, you will receive your results only by: MyChart Message (if you have MyChart) OR A paper copy in the mail If you have any lab test that is abnormal or we need to change your treatment, we will call you to review the results.   Testing/Procedures: Echo in 1 year  Your physician has requested that you have an echocardiogram. Echocardiography is a painless test that uses sound waves to create images of your heart. It provides your doctor with information about the size and shape of your heart and how well your heart's chambers and valves are working. This procedure takes approximately one hour. There are no restrictions for this procedure. Please do NOT wear cologne, perfume, aftershave, or lotions (deodorant is allowed). Please arrive 15 minutes prior to your appointment time.  Please note: We ask at that you not bring children with you during ultrasound (echo/ vascular) testing. Due to room size and safety concerns, children are not allowed in the ultrasound rooms during exams. Our front office staff cannot provide observation of children in our lobby area while testing is being conducted. An adult accompanying a patient to their appointment will only be allowed in the ultrasound room at the discretion of the ultrasound technician under special circumstances. We apologize for any inconvenience.    Follow-Up: At [facility], you and your health needs are our priority.  As part of our continuing mission to provide you with exceptional heart care, we have created designated Provider Care Teams.  These Care Teams include your primary Cardiologist (physician) and Advanced Practice  Providers (APPs -  Physician Assistants and Nurse Practitioners) who all work together to provide you with the care you need, when you need it.  We recommend signing up for the patient portal called "MyChart".  Sign up information is provided on this After Visit Summary.  MyChart is used to connect with patients for Virtual Visits (Telemedicine).  Patients are able to view lab/test results, encounter notes, upcoming appointments, etc.  Non-urgent messages can be sent to your provider as well.   To learn more about what you can do with MyChart, go to [email or web address].    Your next appointment:   1 year(s)  Provider:   Tessa Lerner, DO     Other Instructions   1st Floor: - Lobby - Registration  - Pharmacy  - Lab - Cafe  2nd Floor: - PV Lab - Diagnostic Testing (echo, CT, nuclear med)  3rd Floor: - Vacant  4th Floor: - TCTS (cardiothoracic surgery) - AFib Clinic - Structural Heart Clinic - Vascular Surgery  - Vascular Ultrasound  5th Floor: - [facility] (general and EP) - Clinical Pharmacy for coumadin, hypertension, lipid, weight-loss medications, and med management appointments    Valet parking services will be available as well.

## 2024-01-10 ENCOUNTER — Ambulatory Visit: Admission: EM | Admit: 2024-01-10 | Discharge: 2024-01-10 | Disposition: A | Discharge status: Home / Self Care

## 2024-01-10 ENCOUNTER — Ambulatory Visit (INDEPENDENT_AMBULATORY_CARE_PROVIDER_SITE_OTHER)

## 2024-01-10 DIAGNOSIS — Z860100 Personal history of colon polyps, unspecified: Secondary | ICD-10-CM | POA: Insufficient documentation

## 2024-01-10 DIAGNOSIS — J069 Acute upper respiratory infection, unspecified: Secondary | ICD-10-CM

## 2024-01-10 DIAGNOSIS — R051 Acute cough: Secondary | ICD-10-CM | POA: Diagnosis not present

## 2024-01-10 DIAGNOSIS — K5900 Constipation, unspecified: Secondary | ICD-10-CM | POA: Insufficient documentation

## 2024-01-10 MED ORDER — BENZONATATE 100 MG PO CAPS: 100.0000 mg | ORAL_CAPSULE | Freq: Three times a day (TID) | ORAL | 0 refills | Status: AC

## 2024-01-10 MED ORDER — PREDNISONE 20 MG PO TABS: 20.0000 mg | ORAL_TABLET | Freq: Every day | ORAL | 0 refills | Status: AC

## 2024-01-10 NOTE — ED Provider Notes (Signed)
 [facility]    CSN: [id number] Arrival date & time: 01/10/24  1247      History   Chief Complaint Chief Complaint  Patient presents with   Cough    HPI Darryl Powers is a 78 y.o. male.   78 year old male who presents urgent care with complaints of cough, fever, dizziness from coughing and scratchy throat.  His symptoms started on Tuesday with a scratchy throat then began to progress.  He reports that the coughing is severe at night and that he cannot get any sleep without being upright.  He denies any definitive shortness of breath except when he is coughing.  He reports a fever this morning.  He tried to see his primary care physician but unfortunately was not able to be seen until Monday.  He feels that his symptoms are getting worse.   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   Irregular heart beat 06/14/2020   Acute kidney injury (HCC)    Diverticulitis 01/23/2020   Screening for colon cancer 08/19/2017   Flexor tendon laceration, finger, open wound, sequela 03/26/2017   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 03/13/2017   Low serum HDL 02/13/2017   Need for hepatitis C screening test 02/13/2016   Unilateral inguinal hernia without obstruction or gangrene 02/13/2016   Encounter for general adult medical examination without abnormal findings 03/29/2014   Diverticulitis of colon  07/12/2013   Essential hypertension 07/04/2013   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 Authorizing Provider  ELIQUIS 5 MG TABS tablet Take 5 mg by mouth 2 (two) times daily. 06/14/20  Yes [provider]  fenofibrate (TRICOR) 145 MG tablet Take 1 tablet (145 mg total) by mouth daily. 12/17/23  Yes Tolia, Sunit, DO  hydrochlorothiazide (HYDRODIURIL) 12.5 MG tablet TAKE 1 TABLET BY MOUTH EVERY DAY 10/13/23  Yes Tolia, Sunit, DO  Lancets MISC See admin instructions. 05/30/23  Yes [provider]  losartan (COZAAR) 100 MG tablet TAKE 1 TABLET(100 MG) BY MOUTH EVERY EVENING Patient taking differently: Take 50 mg by mouth daily. 06/29/20  Yes Tolia, Sunit, DO  metoprolol succinate (TOPROL-XL) 25 MG 24 hr tablet TAKE 1 TABLET(25 MG) BY MOUTH DAILY 08/01/23  Yes Tolia, Sunit, DO  pravastatin (PRAVACHOL) 20 MG tablet Take 20 mg by mouth daily.   Yes [provider]  CONTOUR NEXT TEST test strip 1 each by Other route  as directed.    [provider]  psyllium (METAMUCIL SMOOTH TEXTURE) 28 % packet Take 1 packet by mouth 2 (two) times daily.    [provider]    Family History Family History  Problem Relation Age of Onset   Stroke Mother    Aneurysm Father    Leukemia Brother     Social History Social History   Tobacco Use   Smoking status: Never   Smokeless tobacco: Never  Vaping Use   Vaping status: Never Used  Substance Use Topics   Alcohol use: Never   Drug use: Never     Allergies   Ace inhibitors and Amoxicillin-pot  clavulanate   Review of Systems Review of Systems  Constitutional:  Negative for chills and fever.  HENT:  Positive for sore throat. Negative for ear pain.   Eyes:  Negative for pain and visual disturbance.  Respiratory:  Positive for cough. Negative for shortness of breath.   Cardiovascular:  Negative for chest pain and palpitations.  Gastrointestinal:  Negative for abdominal pain and vomiting.  Genitourinary:  Negative for dysuria and hematuria.  Musculoskeletal:  Negative for arthralgias and back pain.  Skin:  Negative for color change and rash.  Neurological:  Positive for dizziness. Negative for seizures and syncope.  All other systems reviewed and are negative.    Physical Exam Triage Vital Signs ED Triage Vitals  Encounter Vitals Group     BP 01/10/24 1415 110/69     Systolic BP Percentile --      Diastolic BP Percentile --      Pulse Rate 01/10/24 1415 85     Resp 01/10/24 1415 20     Temp 01/10/24 1415 99.9 F (37.7 C)     Temp Source 01/10/24 1415 Oral     SpO2 01/10/24 1415 95 %     Weight 01/10/24 1412 197 lb 15.6 oz (89.8 kg)     Height 01/10/24 1412 5\' 9"  (1.753 m)     Head Circumference --      Peak Flow --      Pain Score 01/10/24 1409 0     Pain Loc --      Pain Education --      Exclude from Growth Chart --    No data found.  Updated Vital Signs BP 110/69 (BP Location: Left Arm)   Pulse 89   Temp 99.9 F (37.7 C) (Oral)   Resp 20   Ht 5\' 9"  (1.753 m)   Wt 197 lb 15.6 oz (89.8 kg)   SpO2 96%   BMI 29.24 kg/m   Visual Acuity Right Eye Distance:   Left Eye Distance:   Bilateral Distance:    Right Eye Near:   Left Eye Near:    Bilateral Near:     Physical Exam Vitals and nursing note reviewed.  Constitutional:      General: He is not in acute distress.    Appearance: He is well-developed.  HENT:     Head: Normocephalic and atraumatic.  Eyes:     Conjunctiva/sclera: Conjunctivae normal.  Cardiovascular:     Rate and Rhythm: Normal  rate and regular rhythm.     Heart sounds: No murmur heard. Pulmonary:     Effort: Pulmonary effort is normal. No respiratory distress.     Breath sounds: Normal breath sounds.  Abdominal:     Palpations: Abdomen is soft.     Tenderness: There is no abdominal tenderness.  Musculoskeletal:  General: No swelling.     Cervical back: Neck supple.  Skin:    General: Skin is warm and dry.     Capillary Refill: Capillary refill takes less than 2 seconds.  Neurological:     Mental Status: He is alert.  Psychiatric:        Mood and Affect: Mood normal.      UC Treatments / Results  Labs (all labs ordered are listed, but only abnormal results are displayed) Labs Reviewed - No data to display  EKG   Radiology No results found.  Procedures Procedures (including critical care time)  Medications Ordered in UC Medications - No data to display  Initial Impression / Assessment and Plan / UC Course  I have reviewed the triage vital signs and the nursing notes.  Pertinent labs & imaging results that were available during my care of the patient were reviewed by me and considered in my medical decision making (see chart for details).     Acute cough - Plan: DG Chest 2 View, DG Chest 2 View  Viral upper respiratory tract infection with cough   Chest x-ray done today and final evaluation by the radiologist shows no evidence of acute changes.  Symptoms are most consistent with a viral upper respiratory illness.  This does not require antibiotics.  We will treat with the following: Prednisone 20 mg (1 tablet) once daily for 3 days. Take this in the morning.  This is a steroid to help with inflammation and pain.  Benzonatate (tessalon) 100 mg every 8 hours as needed for cough.  Rest and stay hydrated.   Return to urgent care or PCP if symptoms worsen or fail to resolve.    Final Clinical Impressions(s) / UC Diagnoses   Final diagnoses:  Acute cough   Discharge Instructions    None    ED Prescriptions   None    PDMP not reviewed this encounter.   Landis Martins, [place] 01/10/24 1609

## 2024-01-10 NOTE — Discharge Instructions (Addendum)
 Chest x-ray done today and final evaluation by the radiologist shows no evidence of acute changes.  Symptoms are most consistent with a viral upper respiratory illness.  This does not require antibiotics.  We will treat with the following: Prednisone 20 mg (1 tablet) once daily for 3 days. Take this in the morning.  This is a steroid to help with inflammation and pain.  Benzonatate (tessalon) 100 mg every 8 hours as needed for cough.  Rest and stay hydrated.   Return to urgent care or PCP if symptoms worsen or fail to resolve.

## 2024-01-10 NOTE — ED Triage Notes (Addendum)
"  This started with scratchy throat on Tuesday, then by that night a Cough and getting worse and worse". "By Thursday this was getting really bad, when I took my wife to get blood work I asked the same provider office (that is mine too) to be seen and was given a Monday appointment". "At night the cough is bad and to the point it makes me dizzy at times". No fever known.   Requests CXR.

## 2024-01-28 LAB — LIPID PANEL
Chol/HDL Ratio: 4.1 ratio (ref 0.0–5.0)
Cholesterol, Total: 140 mg/dL (ref 100–199)
HDL: 34 mg/dL — ABNORMAL LOW (ref >39)
LDL Chol Calc (NIH): 83 mg/dL (ref 0–99)
Triglycerides: 127 mg/dL (ref 0–149)
VLDL Cholesterol Cal: 23 mg/dL (ref 5–40)

## 2024-01-28 LAB — COMPREHENSIVE METABOLIC PANEL WITH GFR
ALT: 27 IU/L (ref 0–44)
AST: 23 IU/L (ref 0–40)
Albumin: 4.4 g/dL (ref 3.8–4.8)
Alkaline Phosphatase: 51 IU/L (ref 44–121)
BUN/Creatinine Ratio: 12 (ref 10–24)
BUN: 20 mg/dL (ref 8–27)
Bilirubin Total: 0.5 mg/dL (ref 0.0–1.2)
CO2: 24 mmol/L (ref 20–29)
Calcium: 9.3 mg/dL (ref 8.6–10.2)
Chloride: 101 mmol/L (ref 96–106)
Creatinine, Ser: 1.68 mg/dL — ABNORMAL HIGH (ref 0.76–1.27)
Globulin, Total: 2.6 g/dL (ref 1.5–4.5)
Glucose: 131 mg/dL — ABNORMAL HIGH (ref 70–99)
Potassium: 4 mmol/L (ref 3.5–5.2)
Sodium: 140 mmol/L (ref 134–144)
Total Protein: 7 g/dL (ref 6.0–8.5)
eGFR: 41 mL/min/{1.73_m2} — ABNORMAL LOW (ref >59)

## 2024-02-06 ENCOUNTER — Telehealth: Payer: Self-pay | Admitting: Cardiology

## 2024-02-06 NOTE — Telephone Encounter (Signed)
 Spoke with Pt. Recommendations and results given. All questions answered.

## 2024-02-06 NOTE — Telephone Encounter (Signed)
 Sunit Churchill, DO 01/31/2024  5:09 PM EDT     These labs should reflect initiation of fenofibrate . Triglyceride levels are now at goal and have significantly improved. Incidental finding of slight uptrending of creatinine from 1.5 to 1.7 mg/dL.  Avoid nephrotoxic agents and increase water intake by 3 to 4 glasses/day.   Sunit Tolia, DO, FACC    Left the pt a message to call the office back.

## 2024-02-06 NOTE — Telephone Encounter (Signed)
   Pt is returning call, he is home now until 5pm today

## 2024-02-06 NOTE — Telephone Encounter (Signed)
 Pt returning call to a nurse

## 2024-04-12 ENCOUNTER — Other Ambulatory Visit: Payer: Self-pay | Admitting: Cardiology

## 2024-04-17 ENCOUNTER — Emergency Department (HOSPITAL_BASED_OUTPATIENT_CLINIC_OR_DEPARTMENT_OTHER)
Admission: EM | Admit: 2024-04-17 | Discharge: 2024-04-17 | Disposition: A | Discharge status: Home / Self Care | Attending: Emergency Medicine | Admitting: Emergency Medicine

## 2024-04-17 ENCOUNTER — Encounter (HOSPITAL_BASED_OUTPATIENT_CLINIC_OR_DEPARTMENT_OTHER): Payer: Self-pay | Admitting: Urology

## 2024-04-17 ENCOUNTER — Other Ambulatory Visit: Payer: Self-pay

## 2024-04-17 DIAGNOSIS — T63441A Toxic effect of venom of bees, accidental (unintentional), initial encounter: Secondary | ICD-10-CM | POA: Insufficient documentation

## 2024-04-17 DIAGNOSIS — I10 Essential (primary) hypertension: Secondary | ICD-10-CM | POA: Diagnosis not present

## 2024-04-17 DIAGNOSIS — I4891 Unspecified atrial fibrillation: Secondary | ICD-10-CM | POA: Diagnosis not present

## 2024-04-17 DIAGNOSIS — Z7901 Long term (current) use of anticoagulants: Secondary | ICD-10-CM | POA: Insufficient documentation

## 2024-04-17 DIAGNOSIS — Z79899 Other long term (current) drug therapy: Secondary | ICD-10-CM | POA: Insufficient documentation

## 2024-04-17 MED ORDER — EPINEPHRINE 0.3 MG/0.3ML IJ SOAJ: 0.3000 mg | INTRAMUSCULAR | 0 refills | Status: AC | PRN

## 2024-04-17 MED ORDER — DICLOFENAC SODIUM 1 % EX GEL: 4.0000 g | Freq: Four times a day (QID) | CUTANEOUS | 0 refills | Status: AC

## 2024-04-17 MED ORDER — FAMOTIDINE IN NACL 20-0.9 MG/50ML-% IV SOLN
20.0000 mg | Freq: Once | INTRAVENOUS | Status: AC
  Administered 2024-04-17: 20 mg via INTRAVENOUS

## 2024-04-17 MED ORDER — ACETAMINOPHEN 500 MG PO TABS
1000.0000 mg | ORAL_TABLET | Freq: Once | ORAL | Status: AC
  Administered 2024-04-17: 1000 mg via ORAL
  Filled 2024-04-17: qty 2

## 2024-04-17 MED ORDER — METHYLPREDNISOLONE SODIUM SUCC 125 MG IJ SOLR
125.0000 mg | Freq: Once | INTRAMUSCULAR | Status: AC
  Administered 2024-04-17: 125 mg via INTRAVENOUS
  Filled 2024-04-17: qty 2

## 2024-04-17 NOTE — ED Triage Notes (Signed)
 Pt states yellow jacket stings x 6  to left leg today  States throbbing pain  No known bee allergy  Took benadryl x 2 at 1645

## 2024-04-17 NOTE — ED Provider Notes (Signed)
  Physical Exam  BP (!) 149/75 (BP Location: Left Arm)   Pulse 80   Temp 97.7 F (36.5 C) (Oral)   Resp 16   Ht 5' 9 (1.753 m)   Wt 89.9 kg   SpO2 97%   BMI 29.27 kg/m   Physical Exam  Procedures  Procedures  ED Course / MDM    Medical Decision Making Risk OTC drugs. Prescription drug management.   Patient care handed off from [facility] at shift change.  See prior note for more for details.  In short, 78 year old male presents emergency department with yellowjacket stings.  States that incident occurred around 4 PM today affecting his left ankle as well as left buttock.  States he did take Benadryl without significant.  No symptoms.  States that swelling and pain isolated to areas of stings.  Patient treated with corticosteroid, antihistamine; pending repeat assessment after medications administered.  No chest pain, shortness of breath, feelings of throat closing on him, abdominal pain, nausea, vomiting.  No clinical evidence of angioedema/anaphylaxis.  Plan for discharge if patient feeling better with EpiPen  prescription to use at home if needed.  Reassessment of the patient showed improvement of symptoms.  Minimal localized swelling over areas of insect sting/bite.  Will send an EpiPen  to use if symptoms concerning for anaphylaxis arise.  Recommend local care at home.  Recommend close follow-up with PCP in the outpatient setting.  Treatment plan discussed with patient and he acknowledged understanding was agreeable to said plan.  Patient will well-appearing, afebrile in no acute distress.  Worrisome signs and symptoms were discussed with the patient and the patient acknowledged understanding to return to emergency department if notice.  Patient stable upon discharge.   Silver Wonda LABOR, [place] 04/17/24 1941    Francesca Elsie CROME, MD 04/18/24 [phone]

## 2024-04-17 NOTE — Discharge Instructions (Addendum)
 You have been seen in the emergency room for your yellowjacket stings.  You may continue to use benadryl as needed for itching. If you need Benadryl sedating, you may use 10 mg of cetirizine (Zyrtec) daily instead of the Benadryl. Do not take benadryl and the cetirizine together. These are over the counter medications.  You may take up to 1000mg  of tylenol  every 6 hours as needed for pain.  Do not take more then 4g per day.  Please follow-up with your PCP for recheck if you continue to have pain that persists past the next 3 days  Return to the ER for any difficulty breathing, rash, persistent vomiting, any other new or concerning symptoms

## 2024-04-17 NOTE — ED Provider Notes (Signed)
 [place] EMERGENCY DEPARTMENT AT [facility] [place] Provider Note   CSN: [id number] Arrival date & time: 04/17/24  8179     Patient presents with: Insect Bite   Darryl Powers is a 78 y.o. male with history of A-fib on Eliquis, hypertension, who presents with concern for 5 yellowjacket stings to his left ankle and one sting to his left buttock that occurred at 4 PM today.  States he is having a throbbing pain at these sites where he got stung.  Denies any difficulty breathing or swallowing, lip or tongue swelling, nausea or vomiting, or rash.  States he took 2 Benadryl prior to arrival without relief of symptoms.   HPI     Prior to Admission medications   Medication Sig Start Date End Date Taking? Authorizing Provider  benzonatate  (TESSALON ) 100 MG capsule Take 1 capsule (100 mg total) by mouth every 8 (eight) hours. 01/10/24   White, Elizabeth A, PA-C  CONTOUR NEXT TEST test strip 1 each by Other route as directed.    [provider]  ELIQUIS 5 MG TABS tablet Take 5 mg by mouth 2 (two) times daily. 06/14/20   [provider]  fenofibrate  (TRICOR ) 145 MG tablet Take 1 tablet (145 mg total) by mouth daily. 12/17/23   Tolia, Sunit, DO  hydrochlorothiazide  (HYDRODIURIL ) 12.5 MG tablet TAKE 1 TABLET BY MOUTH EVERY DAY 04/13/24   Michele, Sunit, DO  Lancets MISC See admin instructions. 05/30/23   [provider]  losartan  (COZAAR ) 100 MG tablet TAKE 1 TABLET(100 MG) BY MOUTH EVERY EVENING Patient taking differently: Take 50 mg by mouth daily. 06/29/20   Tolia, Sunit, DO  metoprolol  succinate (TOPROL -XL) 25 MG 24 hr tablet TAKE 1 TABLET(25 MG) BY MOUTH DAILY 08/01/23   Tolia, Sunit, DO  pravastatin (PRAVACHOL) 20 MG tablet Take 20 mg by mouth daily.    [provider]  psyllium (METAMUCIL SMOOTH TEXTURE) 28 % packet Take 1 packet by mouth 2 (two) times daily.    [provider]    Allergies: Ace inhibitors and Amoxicillin-pot clavulanate    Review  of Systems  Skin:  Positive for color change.    Updated Vital Signs BP (!) 149/75 (BP Location: Left Arm)   Pulse 80   Temp 97.7 F (36.5 C) (Oral)   Resp 16   Ht 5' 9 (1.753 m)   Wt 89.9 kg   SpO2 97%   BMI 29.27 kg/m   Physical Exam Vitals and nursing note reviewed.  Constitutional:      General: He is not in acute distress.    Appearance: He is well-developed.  HENT:     Head: Normocephalic and atraumatic.  Eyes:     Conjunctiva/sclera: Conjunctivae normal.  Cardiovascular:     Rate and Rhythm: Normal rate and regular rhythm.     Heart sounds: No murmur heard. Pulmonary:     Effort: Pulmonary effort is normal. No respiratory distress.     Breath sounds: Normal breath sounds.     Comments: Talking in full sentences on room air without difficulty Musculoskeletal:        General: No swelling.     Cervical back: Neck supple.     Comments: Medial aspect of left ankle with blister that is now scabbed over.  Patient reports this has been improving over the past couple days.  Sting sites noted to left ankle which appear like small red dots without surrounding erythema or edema  1 yellowjacket sting site to  left buttock without surrounding erythema or edema  Skin:    General: Skin is warm and dry.     Capillary Refill: Capillary refill takes less than 2 seconds.     Comments: No rash  Neurological:     Mental Status: He is alert.  Psychiatric:        Mood and Affect: Mood normal.      (all labs ordered are listed, but only abnormal results are displayed) Labs Reviewed - No data to display  EKG: None  Radiology: No results found.   Procedures   Medications Ordered in the ED  famotidine  (PEPCID ) IVPB 20 mg premix (20 mg Intravenous New Bag/Given 04/17/24 1847)  methylPREDNISolone  sodium succinate (SOLU-MEDROL ) 125 mg/2 mL injection 125 mg (125 mg Intravenous Given 04/17/24 1844)  acetaminophen  (TYLENOL ) tablet 1,000 mg (1,000 mg Oral Given 04/17/24 1847)                                     Medical Decision Making Risk OTC drugs. Prescription drug management.     Differential diagnosis includes but is not limited to yellowjacket sting, allergic reaction, anaphylaxis  ED Course:  Upon initial evaluation, patient is well-appearing, no acute distress.  Reporting pain to the elbow jacket sting sites of his left lower extremity and left buttock.  The sting sites do not have any surrounding erythema or edema.  No rash.  No hypotension, nausea or vomiting, difficulty breathing, or lip/ tongue/ posterior oropharynx swelling, no concern for anaphylaxis at this time. Will attempt pain control with methylprednisolone , tylenol , and famotidine .  He received Benadryl prior to arrival.  Will avoid NSAID medications due to his CKD.  He does have a scabbed over site on the medial aspect of the left ankle with mild surrounding erythema which appears to be secondary from healing not infection.  Patient reports he was started on prednisone  and doxycycline by his PCP for bee sting that occurred at the site a couple days ago, and this area is improved from how looked previously.  Medications Given: Tylenol  Famotidine  Methylprednisolone   Impression: Yellowjacket stings  Disposition:  Care of this patient signed out to oncoming ED provider Copper Silver, PA-C to re-evaluate patient after medications. Anticipate discharge if pain is improved. Disposition and treatment plan pending imaging results and clinical judgment of oncoming ED team.      Record Review: External records from outside source obtained and reviewed including CMP from April 2025 with elevated creatinine at 1.68, GFR 41     This chart was dictated using voice recognition software, Dragon. Despite the best efforts of this provider to proofread and correct errors, errors may still occur which can change documentation meaning.       Final diagnoses:  Bee sting, accidental or unintentional,  initial encounter    ED Discharge Orders     None          Veta Palma, [place] 04/17/24 1857    Francesca Elsie CROME, MD 04/18/24 [phone]

## 2024-07-13 ENCOUNTER — Other Ambulatory Visit: Payer: Self-pay | Admitting: Cardiology

## 2024-08-01 ENCOUNTER — Other Ambulatory Visit: Payer: Self-pay | Admitting: Cardiology

## 2024-08-01 DIAGNOSIS — I4891 Unspecified atrial fibrillation: Secondary | ICD-10-CM

## 2024-11-15 ENCOUNTER — Ambulatory Visit (HOSPITAL_COMMUNITY)

## 2024-11-18 ENCOUNTER — Ambulatory Visit: Payer: Self-pay | Admitting: Physician Assistant

## 2024-11-18 ENCOUNTER — Ambulatory Visit (HOSPITAL_COMMUNITY)
Admission: RE | Admit: 2024-11-18 | Discharge: 2024-11-18 | Attending: Cardiovascular Disease | Admitting: Cardiovascular Disease

## 2024-11-18 DIAGNOSIS — I4891 Unspecified atrial fibrillation: Secondary | ICD-10-CM | POA: Diagnosis not present

## 2024-11-18 DIAGNOSIS — I4811 Longstanding persistent atrial fibrillation: Secondary | ICD-10-CM

## 2024-11-18 LAB — ECHOCARDIOGRAM COMPLETE
Area-P 1/2: 2.97 cm2
S' Lateral: 1.8 cm
